# Patient Record
Sex: Male | Born: 1960 | Race: White | Hispanic: No | Marital: Single | Smoking: Current every day smoker
Health system: Southern US, Community
[De-identification: ages and names within clinical notes are randomized; demographics above are authoritative.]

## PROBLEM LIST (undated history)

## (undated) DIAGNOSIS — R739 Hyperglycemia, unspecified: Secondary | ICD-10-CM

## (undated) DIAGNOSIS — R631 Polydipsia: Secondary | ICD-10-CM

## (undated) DIAGNOSIS — N189 Chronic kidney disease, unspecified: Secondary | ICD-10-CM

## (undated) DIAGNOSIS — F209 Schizophrenia, unspecified: Secondary | ICD-10-CM

## (undated) DIAGNOSIS — J449 Chronic obstructive pulmonary disease, unspecified: Secondary | ICD-10-CM

## (undated) DIAGNOSIS — I10 Essential (primary) hypertension: Secondary | ICD-10-CM

## (undated) HISTORY — DX: Schizophrenia, unspecified: F20.9

## (undated) HISTORY — PX: TONSILLECTOMY: SUR1361

## (undated) HISTORY — DX: Essential (primary) hypertension: I10

## (undated) HISTORY — DX: Chronic obstructive pulmonary disease, unspecified: J44.9

## (undated) HISTORY — PX: MULTIPLE TOOTH EXTRACTIONS: SHX2053

## (undated) HISTORY — DX: Chronic kidney disease, unspecified: N18.9

## (undated) HISTORY — DX: Hyperglycemia, unspecified: R73.9

---

## 2002-08-15 ENCOUNTER — Emergency Department (HOSPITAL_COMMUNITY): Admission: EM | Admit: 2002-08-15 | Discharge: 2002-08-15 | Payer: Self-pay | Admitting: *Deleted

## 2003-06-27 ENCOUNTER — Emergency Department (HOSPITAL_COMMUNITY): Admission: EM | Admit: 2003-06-27 | Discharge: 2003-06-27 | Payer: Self-pay | Admitting: Emergency Medicine

## 2004-07-22 ENCOUNTER — Emergency Department (HOSPITAL_COMMUNITY): Admission: EM | Admit: 2004-07-22 | Discharge: 2004-07-22 | Payer: Self-pay | Admitting: Emergency Medicine

## 2004-10-25 ENCOUNTER — Ambulatory Visit (HOSPITAL_COMMUNITY): Admission: RE | Admit: 2004-10-25 | Discharge: 2004-10-25 | Payer: Self-pay | Admitting: Pulmonary Disease

## 2006-04-03 ENCOUNTER — Emergency Department (HOSPITAL_COMMUNITY): Admission: EM | Admit: 2006-04-03 | Discharge: 2006-04-03 | Payer: Self-pay | Admitting: Emergency Medicine

## 2006-06-17 ENCOUNTER — Inpatient Hospital Stay (HOSPITAL_COMMUNITY): Admission: EM | Admit: 2006-06-17 | Discharge: 2006-06-20 | Payer: Self-pay | Admitting: *Deleted

## 2006-06-17 ENCOUNTER — Ambulatory Visit: Payer: Self-pay | Admitting: *Deleted

## 2006-07-12 ENCOUNTER — Emergency Department (HOSPITAL_COMMUNITY): Admission: EM | Admit: 2006-07-12 | Discharge: 2006-07-12 | Payer: Self-pay | Admitting: Emergency Medicine

## 2006-08-21 ENCOUNTER — Emergency Department (HOSPITAL_COMMUNITY): Admission: EM | Admit: 2006-08-21 | Discharge: 2006-08-21 | Payer: Self-pay | Admitting: Emergency Medicine

## 2006-08-23 ENCOUNTER — Inpatient Hospital Stay (HOSPITAL_COMMUNITY): Admission: EM | Admit: 2006-08-23 | Discharge: 2006-08-24 | Payer: Self-pay | Admitting: Emergency Medicine

## 2006-10-20 ENCOUNTER — Emergency Department (HOSPITAL_COMMUNITY): Admission: EM | Admit: 2006-10-20 | Discharge: 2006-10-20 | Payer: Self-pay | Admitting: Emergency Medicine

## 2008-01-08 ENCOUNTER — Emergency Department (HOSPITAL_COMMUNITY): Admission: EM | Admit: 2008-01-08 | Discharge: 2008-01-08 | Payer: Self-pay | Admitting: Emergency Medicine

## 2009-07-30 ENCOUNTER — Other Ambulatory Visit: Payer: Self-pay | Admitting: Emergency Medicine

## 2009-07-30 ENCOUNTER — Ambulatory Visit: Payer: Self-pay | Admitting: Psychiatry

## 2009-07-30 ENCOUNTER — Inpatient Hospital Stay (HOSPITAL_COMMUNITY): Admission: AD | Admit: 2009-07-30 | Discharge: 2009-08-06 | Payer: Self-pay | Admitting: Psychiatry

## 2009-08-18 ENCOUNTER — Emergency Department (HOSPITAL_COMMUNITY): Admission: EM | Admit: 2009-08-18 | Discharge: 2009-08-18 | Payer: Self-pay | Admitting: Emergency Medicine

## 2011-03-29 LAB — COMPREHENSIVE METABOLIC PANEL
ALT: 27 U/L (ref 0–53)
AST: 34 U/L (ref 0–37)
Albumin: 4.2 g/dL (ref 3.5–5.2)
Alkaline Phosphatase: 121 U/L — ABNORMAL HIGH (ref 39–117)
BUN: 13 mg/dL (ref 6–23)
CO2: 33 mEq/L — ABNORMAL HIGH (ref 19–32)
Calcium: 10 mg/dL (ref 8.4–10.5)
Chloride: 93 mEq/L — ABNORMAL LOW (ref 96–112)
Creatinine, Ser: 2.78 mg/dL — ABNORMAL HIGH (ref 0.4–1.5)
GFR calc Af Amer: 29 mL/min — ABNORMAL LOW (ref >60)
GFR calc non Af Amer: 24 mL/min — ABNORMAL LOW (ref >60)
Glucose, Bld: 157 mg/dL — ABNORMAL HIGH (ref 70–99)
Potassium: 3.7 mEq/L (ref 3.5–5.1)
Sodium: 134 mEq/L — ABNORMAL LOW (ref 135–145)
Total Bilirubin: 0.3 mg/dL (ref 0.3–1.2)
Total Protein: 7.5 g/dL (ref 6.0–8.3)

## 2011-03-29 LAB — BASIC METABOLIC PANEL
BUN: 12 mg/dL (ref 6–23)
CO2: 31 mEq/L (ref 19–32)
Calcium: 9.4 mg/dL (ref 8.4–10.5)
Chloride: 86 mEq/L — ABNORMAL LOW (ref 96–112)
Creatinine, Ser: 2.55 mg/dL — ABNORMAL HIGH (ref 0.4–1.5)
GFR calc Af Amer: 32 mL/min — ABNORMAL LOW (ref >60)
GFR calc non Af Amer: 27 mL/min — ABNORMAL LOW (ref >60)
Glucose, Bld: 170 mg/dL — ABNORMAL HIGH (ref 70–99)
Potassium: 3.8 mEq/L (ref 3.5–5.1)
Sodium: 123 mEq/L — ABNORMAL LOW (ref 135–145)

## 2011-03-29 LAB — CBC
HCT: 42.8 % (ref 39.0–52.0)
Hemoglobin: 15.2 g/dL (ref 13.0–17.0)
MCHC: 35.5 g/dL (ref 30.0–36.0)
MCV: 89.5 fL (ref 78.0–100.0)
Platelets: 212 10*3/uL (ref 150–400)
RBC: 4.79 MIL/uL (ref 4.22–5.81)
RDW: 14.3 % (ref 11.5–15.5)
WBC: 9.7 10*3/uL (ref 4.0–10.5)

## 2011-03-29 LAB — ETHANOL: Alcohol, Ethyl (B): <5 mg/dL (ref 0–10)

## 2011-03-29 LAB — DIFFERENTIAL
Basophils Absolute: 0 10*3/uL (ref 0.0–0.1)
Basophils Relative: 0 % (ref 0–1)
Eosinophils Absolute: 0.4 10*3/uL (ref 0.0–0.7)
Eosinophils Relative: 4 % (ref 0–5)
Lymphocytes Relative: 14 % (ref 12–46)
Lymphs Abs: 1.3 10*3/uL (ref 0.7–4.0)
Monocytes Absolute: 0.4 10*3/uL (ref 0.1–1.0)
Monocytes Relative: 4 % (ref 3–12)
Neutro Abs: 7.5 10*3/uL (ref 1.7–7.7)
Neutrophils Relative %: 78 % — ABNORMAL HIGH (ref 43–77)

## 2011-05-06 NOTE — Discharge Summary (Signed)
Kyle Valenzuela, Kyle Valenzuela NO.:  [id number]   MEDICAL RECORD NO.:  [id number]          PATIENT TYPE:  IPS   LOCATION:  0402                          FACILITY:  [facility]   PHYSICIAN:  Norm Salt, MD  DATE OF BIRTH:  August 05, 1961   DATE OF ADMISSION:  07/30/2009  DATE OF DISCHARGE:  08/06/2009                               DISCHARGE SUMMARY   IDENTIFYING DATA AND REASON FOR ADMISSION:  This was an inpatient  psychiatric admission for Kyle Valenzuela, a 50 year old unmarried Caucasian  male who was admitted for stabilization of acute agitation within a  context of chronic schizophrenia.  Please refer to the admission note  for further details pertaining to the symptoms, circumstances and  history that led to his hospitalization.  He was given an initial Axis I  diagnosis of schizoaffective disorder, NOS.   MEDICAL AND LABORATORY:  The patient was medically and physically  assessed by the psychiatric nurse practitioner.  He was in good health  without any active or chronic medical problems with the exception of  hypertension which was being addressed with Inderal long-acting, 60 mg  daily.  There were no significant medical issues.  He had a history of  renal insufficiency, but this did not appear to be in evidence during  his stay.   HOSPITAL COURSE:  The patient was admitted to the adult inpatient  psychiatric service.  He presented as a well-nourished, normally-  developed adult male with significant problems with thinking, thought  processing, cognition, and insight.  He was moderately anxious but  generally cooperative and directable.  He had limited understanding of  the situation that he was in.  It was difficult for [place] to determine to  what degree this represented his baseline level of functioning.   He had been living at a group home, and we endeavored to find an  appropriate assisted living situation for him upon his stabilization  with [place].   He was treated with a  psychotropic regimen of Restoril, Xanax, Zyprexa,  and Depakote.   On the eighth hospital day, he appeared appropriate for discharge.  He  was accepted at the [facility] in  [place].  Following aftercare arrangements were made for him.   AFTERCARE:  The patient was to follow up at [facility] in  [place] with an appointment on August 08, 2009, at 8:00 a.m.   DISCHARGE MEDICATIONS:  1. Restoril 15 mg at bedtime.  2. Xanax 1 mg t.i.d.  3. Inderal long-acting 60 mg daily.  4. Zyprexa 15 mg at bedtime and 10 mg q.a.m.  5. Depakote 250 mg q.a.m. and 500 mg at bedtime.   DISCHARGE DIAGNOSES:  AXIS I:  Schizoaffective disorder, not otherwise  specified.  AXIS II:  Deferred.  AXIS III:  History of hypertension.  AXIS IV:  Stressors:  Severe.  AXIS V:  Global Assessment of Functioning on discharge of 45.      Norm Salt, MD  Electronically Signed     SPB/MEDQ  D:  08/08/2009  T:  08/08/2009  Job:  [id number]

## 2011-05-09 NOTE — Discharge Summary (Signed)
NAME:  Kyle Valenzuela, Kyle Valenzuela             ACCOUNT NO.:  [id number]   MEDICAL RECORD NO.:  [id number]          PATIENT TYPE:  INP   LOCATION:  A302                          [facility]ILITY:  [facility]   PHYSICIAN:  Vanetta Mulders. Dechurch, M.D.DATE OF BIRTH:  [birth year]   DATE OF ADMISSION:  08/23/2006  DATE OF DISCHARGE:  09/03/2007LH                                 DISCHARGE SUMMARY   HOSPITAL COURSE:  Briefly, the patient is a 50 year old gentleman followed  by Dr. Luan Pulling, who is a resident of the [facility], who  presented to the emergency room because of cellulitis of the right lower  extremity.  He had an uncomplicated right cellulitis, but was not responding  to outpatient therapy.  He was seen in the emergency room 2 days prior.  Apparently, he had not had any antibiotics as he was unable to have them  filled.  Circumstances were unclear.  In any event, the patient was  admitted to hospital to ensure that he was getting IV antibiotics.  He was  given Unasyn in the emergency room and this was continued.  The patient was  very agitated during his brief hospital stay and stated he would rather have  his foot cut off than to have to stay in the hospital.  Apparently, he is a  heavy tobacco abuser and has been quite agitated at times.  He signed out  AMA, despite efforts to compromise.  The administrator of his group home  agreed to follow up as an outpatient and he may need involuntary commitment  if he remains noncompliant.  The patient has a longstanding history of  apparent schizophrenia and has been followed in the mental health system.   DISCHARGE MEDICATIONS:  He was to continue his usual medications which  include:  1. Abilify 15 mg at bedtime.  2. Restoril 15 mg q.h.s. p.r.n.  3. Zyprexa 15 mg at bedtime.  4. Inderal LA 60 mg daily.  5. Percocet 5/325 q.4 h. p.r.n. pain.  6. He had a prescription for Augmentin 875 mg b.i.d., which was to be      filled through the rest home  and completed.   Because the patient signed before full assessment could be undertaken, this  record is not deemed complete.  The details of the patient's status were  discussed with Dr. Luan Pulling on August 24, 2006.      Vanetta Mulders Hillery Jacks, M.D.  Electronically Signed     FED/MEDQ  D:  09/14/2006  T:  09/15/2006  Job:  [id number]

## 2011-05-09 NOTE — Discharge Summary (Signed)
NAME:  Kyle Valenzuela, Kyle Valenzuela NO.:  [id number]   MEDICAL RECORD NO.:  [id number]          PATIENT TYPE:  IPS   LOCATION:  0405                          [facility]ILITY:  [facility]   PHYSICIAN:  Stark Jock, M.D. DATE OF BIRTH:  12/18/1961   DATE OF ADMISSION:  06/17/2006  DATE OF DISCHARGE:  06/20/2006                                 DISCHARGE SUMMARY   IDENTIFYING INFORMATION:  The patient is a 50 year old single Caucasian male  who was admitted on a voluntary basis to my service on June 17, 2006.   HISTORY OF PRESENT ILLNESS:  The patient presented in the ED stating that he  wanted to kill himself if he had to return to live in his current assisted-  living home ([place]).  He stated he does  not like living there and does not want to return.  He states that they had  caught me at a bad moment and I wanted to get out of there.  He denied  suicidal ideation on talking with the examiner today but had been suicidal  the night before.   PAST PSYCHIATRIC HISTORY:  The patient sees Dr. Theda Sers at [facility].  This is his first [facility] admission.  He has had a  history of previous admissions to [facility] 10 years ago.  He  has been diagnosed with schizoaffective disorder, bipolar-type.  He has  previously been on lithium and Depakote.   FAMILY HISTORY:  Not available.   SUBSTANCE ABUSE HISTORY:  No evidence of substance abuse.   MEDICAL HISTORY:  The patient has elevated creatinine and history of  treatment with lithium.   MEDICATIONS:  Zyprexa 15 mg p.o. q.h.s., Inderal LA 60 mg p.o. q.a.m.,  Abilify 15 mg p.o. q.h.s., temazepam 15 mg p.o. q.h.s.   ALLERGIES:  No known drug allergies.   PHYSICAL EXAMINATION:  The physical examination was done in the ED prior to  transfer to our unit.  It was evaluated by our nurse practitioner.  He was  in no acute distress.  He was noted to be edentulous and had a  protuberant  abdomen but otherwise was healthy.   LABORATORY DATA:  CBC with WBC count 6.7, hemoglobin 15.8, hematocrit 45.3,  platelets 131,000.  Basic metabolic panel with sodium 138, potassium 3.2,  chloride 103, CO2 25, BUN 16, creatinine 2.9, glucose 107, calcium 9.1.  Alcohol level was less than 5.  UDS was negative.   HOSPITAL COURSE:  Upon admission, the patient was started on Abilify 15 mg  p.o. q.h.s., Inderal LA 60 mg p.o. q.d., Zyprexa 15 mg p.o. q.h.s. and  temazepam 15 mg p.o. q.h.s.  On June 18, 2006, the patient was started on a  nicotine patch 21 mg daily.  The patient tolerated his medications well with  no significant side effects.  There were no changes made in his medications  as he seemed to stabilize quickly after admission.   Upon admission, the patient was seen by me and talked about his suicidal  statements.  He stated he had had a  plan to walk into traffic.  He stated he  did not want to stay at his current assisted-living home and was attempting  to get out of this placement.  It was unclear how serious his suicidal  ideation was versus manipulative.  On June 18, 2006, the patient was quiet  and isolative and somewhat withdrawn.  He stayed in his room a lot with  minimal interaction with others.  He stated he was anxious.  On June 19, 2006, the patient was much more conversant.  He was asking for discharge.  He was stating he was never suicidal but wanted to get out of his living  situation.  He states he now wants to return to this assisted-living home  (he has lived there for 10 years).  He denies suicidal or homicidal  ideation.  He was less depressed.  There was no psychosis.  It was decided  he would be discharged tomorrow.   On June 20, 2006, the patient's mental status had improved markedly from  admission status.  He was much more forthcoming and engaging with good eye  contact.  Speech still somewhat soft and slow but less so than upon  admission.   Psychomotor activity was within normal limits.  Mood less  depressed and anxious.  Affect wider range.  There was no suicidal or  homicidal ideation.  No self-injurious behavior.  No auditory or visual  hallucinations.  No paranoia or delusions.  Thoughts were logical and goal  directed.  Thought content with no predominant theme.  Cognitive exam was  grossly within normal limits.  The patient was excited about his return to  his current residence, the [facility].   DISCHARGE DIAGNOSES:  AXIS I:  Schizoaffective disorder, bipolar-type.  AXIS II:  None.  AXIS III:  Thrombocytopenia not otherwise specified, elevated creatinine  (creatinine is stable compared to values one year ago), rule out renal  insufficiency.  AXIS IV:  Moderate (housing problem and problems with primary support  group).  AXIS V:  GAF upon discharge 66; GAF upon admission 32; GAF highest past year  50.   ACTIVITY/DIET:  No specific activity level or dietary restrictions.   DISCHARGE MEDICATIONS:  1.  Abilify 15 mg p.o. q.h.s.  2.  Inderal LA 60 mg p.o. q.d.  3.  Zyprexa 15 mg p.o. q.h.s.  4.  Temazepam 15 mg p.o. q.h.s.   POST-HOSPITAL CARE PLANS:  The patient will return home to live at the  [facility].  He has lived there for 10 years  and is well-known to the staff.  Follow-up psychiatric treatment will be at  the [facility] with Dr. Theda Sers on June 25, 2006  at 10 a.m.      Stark Jock, M.D.  Electronically Signed     BHS/MEDQ  D:  06/20/2006  T:  06/20/2006  Job:  [id number]

## 2012-02-27 ENCOUNTER — Encounter: Payer: Self-pay | Admitting: Surgery

## 2012-03-01 ENCOUNTER — Encounter (INDEPENDENT_AMBULATORY_CARE_PROVIDER_SITE_OTHER): Payer: Medicare Other | Admitting: *Deleted

## 2012-03-01 ENCOUNTER — Encounter: Payer: Self-pay | Admitting: Surgery

## 2012-03-01 ENCOUNTER — Ambulatory Visit (INDEPENDENT_AMBULATORY_CARE_PROVIDER_SITE_OTHER): Payer: Medicare Other | Admitting: Surgery

## 2012-03-01 VITALS — BP 129/82 | HR 80 | Resp 16 | Ht 68.0 in | Wt 180.0 lb

## 2012-03-01 DIAGNOSIS — Z01810 Encounter for preprocedural cardiovascular examination: Secondary | ICD-10-CM

## 2012-03-01 DIAGNOSIS — N186 End stage renal disease: Secondary | ICD-10-CM

## 2012-03-01 DIAGNOSIS — I129 Hypertensive chronic kidney disease with stage 1 through stage 4 chronic kidney disease, or unspecified chronic kidney disease: Secondary | ICD-10-CM

## 2012-03-01 NOTE — Progress Notes (Signed)
[facility]ascular and [facility]in Specialist of [facility]   Patient name: Kyle Valenzuela MRN: [id number] DOB: Feb 03, 1961 Sex: male   Referred by: Dr. Lowanda Foster  Reason for referral:  Chief Complaint  Patient presents with  . New Evaluation    Eval for access placement for dialysis - Ref. Dr. Dustin Flock    HISTORY OF PRESENT ILLNESS: The patient comes in today for evaluation of dialysis access. He is not yet on dialysis. He is right-handed. The patient's renal insufficiency a secondary to hypertension. He does not have signs of uremia at this time.  The patient suffers from schizophrenia and is in a group home. He also has a history of COPD secondary to tobacco abuse.  Past Medical History  Diagnosis Date  . Chronic kidney disease   . Hypertension   . Schizophrenia   . COPD (chronic obstructive pulmonary disease)   . Hyperglycemia     History reviewed. No pertinent past surgical history.  History   Social History  . Marital Status: Single    Spouse Name: N/A    Number of Children: N/A  . Years of Education: N/A   Occupational History  . Not on file.   Social History Main Topics  . Smoking status: Current Everyday Smoker -- 5.0 packs/day    Types: Cigarettes  . Smokeless tobacco: Not on file  . Alcohol Use: No  . Drug Use: No  . Sexually Active: Not on file   Other Topics Concern  . Not on file   Social History Narrative  . No narrative on file    History reviewed. No pertinent family history.  Allergies as of 03/01/2012  . (No Known Allergies)    Current Outpatient Prescriptions on File Prior to Visit  Medication Sig Dispense Refill  . ALPRAZolam (XANAX) 1 MG tablet Take 1 mg by mouth at bedtime as needed.      Marland Kitchen amLODipine (NORVASC) 10 MG tablet Take 10 mg by mouth daily.      . carvedilol (COREG) 6.25 MG tablet Take 6.25 mg by mouth 2 (two) times daily with a meal.      . divalproex (DEPAKOTE) 500 MG DR tablet Take 500 mg by mouth 2 (two) times daily.      .  hydrOXYzine (ATARAX/VISTARIL) 50 MG tablet Take 50 mg by mouth 3 (three) times daily as needed.      Marland Kitchen OLANZapine (ZYPREXA) 10 MG tablet Take 10 mg by mouth daily.      Marland Kitchen OLANZapine (ZYPREXA) 15 MG tablet Take 15 mg by mouth at bedtime.      . traZODone (DESYREL) 150 MG tablet Take 150 mg by mouth at bedtime.         REVIEW OF SYSTEMS: No chest pain no shortness of breath no neurologic symptoms. Positive for depression all of systems negative as documented in the encounter for  PHYSICAL EXAMINATION: General: The patient appears their stated age.  Vital signs are BP 129/82  Pulse 80  Resp 16  Ht 5\' 8"  (1.727 m)  Wt 180 lb (81.647 kg)  BMI 27.37 kg/m2  SpO2 99% HEENT:  No gross abnormalities Pulmonary: Respirations are non-labored Abdomen: Soft and non-tender  Musculoskeletal: There are no major deformities.   Neurologic: No focal weakness or paresthesias are detected, Skin: There are no ulcer or rashes noted. Psychiatric: The patient has normal affect. Cardiovascular: Palpable right radial and brachial pulse  Diagnostic Studies: Vein mapping reveals adequate right cephalic vein it measures 0.3 in the distal forearm and  narrows down to 0.26 in the midforearm and from there more proximal it is greater than 0.37  Outside Studies/Documentation Historical records were reviewed.  They showed stage chronic renal insufficiency  Medication Changes: None  Assessment:  Chronic renal insufficiency Plan: I had a lengthy discussion with the patient regarding dialysis access. I think he is a good candidate for a right radiocephalic fistula. We discussed the risks of non-maturity and the risk of steal syndrome. The patient is a little over wound was all the information and has not yet ready to make a decision. He will contact my office when he is ready.     Eldridge Abrahams, M.D. [facility] [place] Office: [phone] Pager:  [phone]

## 2012-03-08 NOTE — Procedures (Unsigned)
CEPHALIC VEIN MAPPING  INDICATION:  Chronic kidney disease secondary to hypertension.  HISTORY:  EXAM:  The right cephalic vein is compressible with diameter measurements ranging from 0.26 to 0.54 cm.  The right basilic vein is compressible with diameter measurements ranging from 0.44 to 0.48 cm.  The left cephalic vein is compressible with diameter measurements ranging from 0.18 to 0.33 cm.  The left basilic vein is compressible with diameter measurements ranging from 0.30 to 0.59 cm.  See attached worksheet for all measurements.  IMPRESSION:  Patent bilateral cephalic and basilic veins noted with diameters as described above.  ___________________________________________ V. Leia Alf, MD  CH/MEDQ  D:  03/02/2012  T:  03/02/2012  Job:  [id number]

## 2012-04-12 ENCOUNTER — Telehealth: Payer: Self-pay | Admitting: *Deleted

## 2012-04-12 NOTE — Telephone Encounter (Signed)
Cenedra works at the group home in [place] where Mr Kyle Valenzuela resides.She said her supervisor asked her to call and see if we could do his surgery in [place]. She said that was the only way the pt would consent to surgery. I told her we were privileged with the [facility] system and could/would not operate at [facility].I suggested she cal Dr Lowanda Foster and talk with that office.

## 2012-04-14 ENCOUNTER — Other Ambulatory Visit: Payer: Self-pay | Admitting: *Deleted

## 2012-04-14 ENCOUNTER — Encounter (HOSPITAL_COMMUNITY): Payer: Self-pay | Admitting: Pharmacy Technician

## 2012-04-14 ENCOUNTER — Encounter: Payer: Self-pay | Admitting: *Deleted

## 2012-04-16 ENCOUNTER — Inpatient Hospital Stay (HOSPITAL_COMMUNITY): Admission: RE | Admit: 2012-04-16 | Discharge: 2012-04-16 | Payer: Medicare Other | Source: Ambulatory Visit

## 2012-04-19 NOTE — Pre-Procedure Instructions (Signed)
[place]  04/19/2012   Your procedure is scheduled on:  Apr 22, 2012  Report to [place] at 7:30 AM.  Call this number if you have problems the morning of surgery: [phone]   Remember:   Do not eat food:After Midnight.  May have clear liquids: up to 4 Hours before arrival(3:30 am).  Clear liquids include soda, tea, black coffee, apple or grape juice, broth.  Take these medicines the morning of surgery with A SIP OF WATER: depakote, coreg, novasc,inhaler,xanax/tylenol as needed   Do not wear jewelry, make-up or nail polish.  Do not wear lotions, powders, or perfumes. You may wear deodorant.  Do not shave 48 hours prior to surgery.  Do not bring valuables to the hospital.  Contacts, dentures or bridgework may not be worn into surgery.  Leave suitcase in the car. After surgery it may be brought to your room.  For patients admitted to the hospital, checkout time is 11:00 AM the day of discharge.   Patients discharged the day of surgery will not be allowed to drive home.  Name and phone number of your driver: Mady Gemma (group home manager)  [phone]  Special Instructions: CHG Shower Use Special Wash: 1/2 bottle night before surgery and 1/2 bottle morning of surgery.   Please read over the following fact sheets that you were given: Pain Booklet, Coughing and Deep Breathing and Surgical Site Infection Prevention

## 2012-04-20 ENCOUNTER — Encounter (HOSPITAL_COMMUNITY)
Admission: RE | Admit: 2012-04-20 | Discharge: 2012-04-20 | Disposition: A | Discharge status: Home / Self Care | Payer: Medicare Other | Source: Ambulatory Visit | Attending: Surgery | Admitting: Surgery

## 2012-04-20 ENCOUNTER — Encounter (HOSPITAL_COMMUNITY)
Admission: RE | Admit: 2012-04-20 | Discharge: 2012-04-20 | Disposition: A | Discharge status: Home / Self Care | Payer: Medicare Other | Source: Ambulatory Visit | Attending: Anesthesiology | Admitting: Anesthesiology

## 2012-04-20 ENCOUNTER — Encounter (HOSPITAL_COMMUNITY): Payer: Self-pay

## 2012-04-20 LAB — CBC
HCT: 41.1 % (ref 39.0–52.0)
Hemoglobin: 14 g/dL (ref 13.0–17.0)
MCH: 29.7 pg (ref 26.0–34.0)
MCHC: 34.1 g/dL (ref 30.0–36.0)
MCV: 87.3 fL (ref 78.0–100.0)
Platelets: 140 K/uL — ABNORMAL LOW (ref 150–400)
RBC: 4.71 MIL/uL (ref 4.22–5.81)
RDW: 13.3 % (ref 11.5–15.5)
WBC: 5.4 K/uL (ref 4.0–10.5)

## 2012-04-20 LAB — SURGICAL PCR SCREEN
MRSA, PCR: NEGATIVE
Staphylococcus aureus: NEGATIVE

## 2012-04-21 MED ORDER — DEXTROSE 5 % IV SOLN
1.5000 g | INTRAVENOUS | Status: AC
  Administered 2012-04-22: 1.5 g via INTRAVENOUS
  Filled 2012-04-21: qty 1.5

## 2012-04-21 MED ORDER — SODIUM CHLORIDE 0.9 % IV SOLN
INTRAVENOUS | Status: DC
  Administered 2012-04-22: 11:00:00 via INTRAVENOUS

## 2012-04-21 NOTE — Consult Note (Signed)
Anesthesia Chart Review:  Patient is a 51 year old male scheduled for creation of a right Cimino AVF on 04/22/12.  This  History includes CKD not yet on HD, HTN, Schizophrenia, smoking, COPD, hyperglycemia without diagnosis of DM.  Nephrologist is Dr. Hinda Lenis.  CXR from 04/20/12 shows no active cardiopulmonary disease.  EKG from 04/20/12 shows NSR, LAD, incomplete right BBB, cannot rule out inferior infarct (age undetermined).  Currently there are no comparison EKGs available.  There were none at [facility] or in [place].  He is for labs on arrival.  No CV symptoms documented at PAT and no known history of CAD, although with multiple risk factors.  He need access in anticipation of HD in the near future.  If remains asymptomatic and labs reasonable, then anticipate he can proceed with this procedure.  I reviewed above with Anesthesiologist Dr. Conrad Ketchum who agrees with this plan.  Myra Gianotti, PA-C

## 2012-04-22 ENCOUNTER — Ambulatory Visit (HOSPITAL_COMMUNITY)
Admission: RE | Admit: 2012-04-22 | Discharge: 2012-04-22 | Disposition: A | Discharge status: Hospice | Payer: Medicare Other | Source: Ambulatory Visit | Attending: Surgery | Admitting: Surgery

## 2012-04-22 ENCOUNTER — Encounter (HOSPITAL_COMMUNITY)
Admission: RE | Disposition: A | Discharge status: Hospice | Payer: Self-pay | Source: Ambulatory Visit | Attending: Surgery

## 2012-04-22 ENCOUNTER — Encounter (HOSPITAL_COMMUNITY): Payer: Self-pay | Admitting: Vascular Surgery

## 2012-04-22 ENCOUNTER — Ambulatory Visit (HOSPITAL_COMMUNITY): Payer: Medicare Other | Admitting: Vascular Surgery

## 2012-04-22 DIAGNOSIS — R7309 Other abnormal glucose: Secondary | ICD-10-CM | POA: Insufficient documentation

## 2012-04-22 DIAGNOSIS — F209 Schizophrenia, unspecified: Secondary | ICD-10-CM | POA: Insufficient documentation

## 2012-04-22 DIAGNOSIS — Z01812 Encounter for preprocedural laboratory examination: Secondary | ICD-10-CM | POA: Insufficient documentation

## 2012-04-22 DIAGNOSIS — F17200 Nicotine dependence, unspecified, uncomplicated: Secondary | ICD-10-CM | POA: Insufficient documentation

## 2012-04-22 DIAGNOSIS — N184 Chronic kidney disease, stage 4 (severe): Secondary | ICD-10-CM | POA: Insufficient documentation

## 2012-04-22 DIAGNOSIS — J4489 Other specified chronic obstructive pulmonary disease: Secondary | ICD-10-CM | POA: Insufficient documentation

## 2012-04-22 DIAGNOSIS — J449 Chronic obstructive pulmonary disease, unspecified: Secondary | ICD-10-CM | POA: Insufficient documentation

## 2012-04-22 DIAGNOSIS — I129 Hypertensive chronic kidney disease with stage 1 through stage 4 chronic kidney disease, or unspecified chronic kidney disease: Secondary | ICD-10-CM | POA: Insufficient documentation

## 2012-04-22 HISTORY — PX: AV FISTULA PLACEMENT: SHX1204

## 2012-04-22 LAB — PROTIME-INR
INR: 0.97 (ref 0.00–1.49)
Prothrombin Time: 13.1 seconds (ref 11.6–15.2)

## 2012-04-22 LAB — POCT I-STAT 4, (NA,K, GLUC, HGB,HCT)
Glucose, Bld: 90 mg/dL (ref 70–99)
HCT: 44 % (ref 39.0–52.0)
Sodium: 145 mEq/L (ref 135–145)

## 2012-04-22 SURGERY — ARTERIOVENOUS (AV) FISTULA CREATION
Anesthesia: General | Site: Arm Lower | Laterality: Right | Wound class: Clean

## 2012-04-22 MED ORDER — SODIUM CHLORIDE 0.9 % IV SOLN
INTRAVENOUS | Status: DC | PRN
  Administered 2012-04-22 (×2): via INTRAVENOUS

## 2012-04-22 MED ORDER — OXYCODONE HCL 5 MG PO TABS: 5.0000 mg | ORAL_TABLET | ORAL | Status: AC | PRN

## 2012-04-22 MED ORDER — PROTAMINE SULFATE 10 MG/ML IV SOLN
INTRAVENOUS | Status: DC | PRN
  Administered 2012-04-22 (×2): 5 mg via INTRAVENOUS

## 2012-04-22 MED ORDER — FENTANYL CITRATE 0.05 MG/ML IJ SOLN
INTRAMUSCULAR | Status: DC | PRN
  Administered 2012-04-22 (×2): 25 ug via INTRAVENOUS
  Administered 2012-04-22 (×2): 50 ug via INTRAVENOUS

## 2012-04-22 MED ORDER — 0.9 % SODIUM CHLORIDE (POUR BTL) OPTIME
TOPICAL | Status: DC | PRN
  Administered 2012-04-22: 1000 mL

## 2012-04-22 MED ORDER — PROPOFOL 10 MG/ML IV EMUL
INTRAVENOUS | Status: DC | PRN
  Administered 2012-04-22: 200 mg via INTRAVENOUS

## 2012-04-22 MED ORDER — HEPARIN SODIUM (PORCINE) 5000 UNIT/ML IJ SOLN
INTRAMUSCULAR | Status: DC | PRN
  Administered 2012-04-22: 12:00:00

## 2012-04-22 MED ORDER — HEPARIN SODIUM (PORCINE) 1000 UNIT/ML IJ SOLN
INTRAMUSCULAR | Status: DC | PRN
  Administered 2012-04-22: 3 mL via INTRAVENOUS

## 2012-04-22 MED ORDER — HYDROMORPHONE HCL PF 1 MG/ML IJ SOLN: 0.2500 mg | INTRAMUSCULAR | Status: DC | PRN

## 2012-04-22 MED ORDER — ONDANSETRON HCL 4 MG/2ML IJ SOLN
INTRAMUSCULAR | Status: DC | PRN
  Administered 2012-04-22: 4 mg via INTRAVENOUS

## 2012-04-22 MED ORDER — MIDAZOLAM HCL 5 MG/5ML IJ SOLN
INTRAMUSCULAR | Status: DC | PRN
  Administered 2012-04-22 (×2): 1 mg via INTRAVENOUS

## 2012-04-22 MED ORDER — ONDANSETRON HCL 4 MG/2ML IJ SOLN: 4.0000 mg | Freq: Once | INTRAMUSCULAR | Status: DC | PRN

## 2012-04-22 SURGICAL SUPPLY — 41 items
ADH SKN CLS APL DERMABOND .7 (GAUZE/BANDAGES/DRESSINGS) ×1
CANISTER SUCTION 2500CC (MISCELLANEOUS) ×2 IMPLANT
CLIP TI MEDIUM 6 (CLIP) ×2 IMPLANT
CLIP TI WIDE RED SMALL 6 (CLIP) ×2 IMPLANT
CLOTH BEACON ORANGE TIMEOUT ST (SAFETY) ×2 IMPLANT
COVER PROBE W GEL 5X96 (DRAPES) ×2 IMPLANT
COVER SURGICAL LIGHT HANDLE (MISCELLANEOUS) ×4 IMPLANT
DERMABOND ADVANCED (GAUZE/BANDAGES/DRESSINGS) ×1
DERMABOND ADVANCED .7 DNX12 (GAUZE/BANDAGES/DRESSINGS) ×1 IMPLANT
ELECT REM PT RETURN 9FT ADLT (ELECTROSURGICAL) ×2
ELECTRODE REM PT RTRN 9FT ADLT (ELECTROSURGICAL) ×1 IMPLANT
GLOVE BIOGEL M 6.5 STRL (GLOVE) ×1 IMPLANT
GLOVE BIOGEL M 7.0 STRL (GLOVE) ×1 IMPLANT
GLOVE BIOGEL PI IND STRL 6.5 (GLOVE) IMPLANT
GLOVE BIOGEL PI IND STRL 7.0 (GLOVE) IMPLANT
GLOVE BIOGEL PI IND STRL 7.5 (GLOVE) ×1 IMPLANT
GLOVE BIOGEL PI INDICATOR 6.5 (GLOVE) ×3
GLOVE BIOGEL PI INDICATOR 7.0 (GLOVE) ×4
GLOVE BIOGEL PI INDICATOR 7.5 (GLOVE) ×1
GLOVE ECLIPSE 6.5 STRL STRAW (GLOVE) ×3 IMPLANT
GLOVE SURG SS PI 7.0 STRL IVOR (GLOVE) ×2 IMPLANT
GLOVE SURG SS PI 7.5 STRL IVOR (GLOVE) ×2 IMPLANT
GOWN PREVENTION PLUS XLARGE (GOWN DISPOSABLE) ×2 IMPLANT
GOWN PREVENTION PLUS XXLARGE (GOWN DISPOSABLE) ×2 IMPLANT
GOWN STRL NON-REIN LRG LVL3 (GOWN DISPOSABLE) ×8 IMPLANT
HEMOSTAT SNOW SURGICEL 2X4 (HEMOSTASIS) IMPLANT
HEMOSTAT SURGICEL 2X14 (HEMOSTASIS) IMPLANT
KIT BASIN OR (CUSTOM PROCEDURE TRAY) ×2 IMPLANT
KIT ROOM TURNOVER OR (KITS) ×2 IMPLANT
NS IRRIG 1000ML POUR BTL (IV SOLUTION) ×2 IMPLANT
PACK CV ACCESS (CUSTOM PROCEDURE TRAY) ×2 IMPLANT
PAD ARMBOARD 7.5X6 YLW CONV (MISCELLANEOUS) ×4 IMPLANT
SUT PROLENE 6 0 CC (SUTURE) ×2 IMPLANT
SUT PROLENE 7 0 BV 1 (SUTURE) ×4 IMPLANT
SUT VIC AB 3-0 SH 27 (SUTURE) ×2
SUT VIC AB 3-0 SH 27X BRD (SUTURE) ×1 IMPLANT
SUT VICRYL 4-0 PS2 18IN ABS (SUTURE) IMPLANT
TOWEL OR 17X24 6PK STRL BLUE (TOWEL DISPOSABLE) ×2 IMPLANT
TOWEL OR 17X26 10 PK STRL BLUE (TOWEL DISPOSABLE) ×2 IMPLANT
UNDERPAD 30X30 INCONTINENT (UNDERPADS AND DIAPERS) ×2 IMPLANT
WATER STERILE IRR 1000ML POUR (IV SOLUTION) ×2 IMPLANT

## 2012-04-22 NOTE — Anesthesia Preprocedure Evaluation (Addendum)
Anesthesia Evaluation  Patient identified by MRN, date of birth, ID band Patient awake    Reviewed: Allergy & Precautions, H&P , NPO status , Patient's Chart, lab work & pertinent test results  History of Anesthesia Complications Negative for: history of anesthetic complications  Airway Mallampati: II TM Distance: >3 FB     Dental  (+) Edentulous Upper, Edentulous Lower and Dental Advisory Given   Pulmonary COPDCurrent Smoker,  breath sounds clear to auscultation        Cardiovascular hypertension, Pt. on home beta blockers Rhythm:Regular Rate:Normal     Neuro/Psych Schizophrenia negative neurological ROS  negative psych ROS   GI/Hepatic negative GI ROS, Neg liver ROS,   Endo/Other  negative endocrine ROS  Renal/GU negative Renal ROS  negative genitourinary   Musculoskeletal negative musculoskeletal ROS (+)   Abdominal   Peds  Hematology negative hematology ROS (+)   Anesthesia Other Findings   Reproductive/Obstetrics                         Anesthesia Physical Anesthesia Plan  ASA: III  Anesthesia Plan: General   Post-op Pain Management:    Induction: Intravenous  Airway Management Planned: LMA  Additional Equipment:   Intra-op Plan:   Post-operative Plan: Extubation in OR  Informed Consent: I have reviewed the patients History and Physical, chart, labs and discussed the procedure including the risks, benefits and alternatives for the proposed anesthesia with the patient or authorized representative who has indicated his/her understanding and acceptance.   Dental advisory given  Plan Discussed with: CRNA, Anesthesiologist and Surgeon  Anesthesia Plan Comments: (ESRD not started dialysis K-3.9 Htn Chronic schiazophrenia)      Anesthesia Quick Evaluation

## 2012-04-22 NOTE — Preoperative (Signed)
Beta Blockers   Reason not to administer Beta Blockers:Coreg taken this am 04/22/12

## 2012-04-22 NOTE — Transfer of Care (Signed)
Immediate Anesthesia Transfer of Care Note  Patient: Kyle Valenzuela  Procedure(s) Performed: Procedure(s) (LRB): ARTERIOVENOUS (AV) FISTULA CREATION (Right)  Patient Location: PACU  Anesthesia Type: General  Level of Consciousness: awake and patient cooperative  Airway & Oxygen Therapy: Patient Spontanous Breathing and Patient connected to face mask oxygen  Post-op Assessment: Report given to PACU RN and Post -op Vital signs reviewed and stable  Post vital signs: Reviewed and stable  Complications: No apparent anesthesia complications

## 2012-04-22 NOTE — Op Note (Signed)
Vascular and Vein Specialists of [facility]  Patient name: Kyle Valenzuela MRN: [id number] DOB: April 03, 1961 Sex: male  04/22/2012 Pre-operative Diagnosis: CKD, stage 4 Post-operative diagnosis:  Same Surgeon:  Eldridge Abrahams Assistants:  Wray Kearns, P.A. Procedure:   Right radio-cephalic fistula Anesthesia:  General Blood Loss:  See anesthesia record Specimens:  none  Findings:  1.28mm artery, vein dilated to 3.0  Indications:  50 yo with stage 4 renal disease comes in today for fistula placement  Procedure:  The patient was identified in the holding area and taken to [place]  The patient was then placed supine on the table. general anesthesia was administered.  The patient was prepped and draped in the usual sterile fashion.  A time out was called and antibiotics were administered.  Ultrasound was used to mark the course of the cephalic vein in the forearm.  A longitudinal incision was made between the course of the radial artery and cephalic vein.  The vein was dissected out first.  It was fully mobilized throughout the incision.  Next, The artery was isolated.  3000 units of heparin was administered.  The vein was marked for orientation.  It was ligated distally with a 3-0 silk tie.  The vein was then sequentially dilated using dilators up to a #3.  The artery was then occluded.  A #11 blade was used to make an arteriotomy which was extended with potts scissors.  The vein was spatulated to fit the size of the arteriotomy.  A running anastomosis was created in end to side fashion with 7-0 prolene.  Prior to completion, the appropriate flush maneuvers were performed.  The anastomosis was completed.  I inspected the course of the vein to make sure there were no kinks.  There was a good doppler signal in the vein up to the elbow.  The radial and ulnar artery had multiphasic signals distal to the anastomosis.  25 mg of protamine was given.  Once hemostasis was obtained, the tissue was  closed in 2 layers of 3-0 vicryl.  Dermabond was placed   Disposition:  To PACU in stable condition.   Theotis Burrow, M.D. [facility] Specialists of [place] Office: [phone] Pager:  [phone]

## 2012-04-22 NOTE — Anesthesia Procedure Notes (Signed)
Procedure Name: LMA Insertion Date/Time: 04/22/2012 12:39 PM Performed by: Sherilyn Banker Pre-anesthesia Checklist: Patient identified, Emergency Drugs available, Suction available, Patient being monitored and Timeout performed Patient Re-evaluated:Patient Re-evaluated prior to inductionOxygen Delivery Method: Circle system utilized Preoxygenation: Pre-oxygenation with 100% oxygen Intubation Type: IV induction LMA: LMA inserted LMA Size: 5.0 Tube secured with: Tape Dental Injury: Teeth and Oropharynx as per pre-operative assessment

## 2012-04-22 NOTE — Anesthesia Postprocedure Evaluation (Signed)
  Anesthesia Post-op Note  Patient: Kyle Valenzuela  Procedure(s) Performed: Procedure(s) (LRB): ARTERIOVENOUS (AV) FISTULA CREATION (Right)  Patient Location: PACU  Anesthesia Type: General  Level of Consciousness: awake  Airway and Oxygen Therapy: Patient Spontanous Breathing  Post-op Pain: mild  Post-op Assessment: Post-op Vital signs reviewed, Patient's Cardiovascular Status Stable, Respiratory Function Stable, Patent Airway, No signs of Nausea or vomiting and Pain level controlled  Post-op Vital Signs: stable  Complications: No apparent anesthesia complications

## 2012-04-22 NOTE — Discharge Instructions (Signed)
     Instructions Following General Anesthetic, Adult A nurse specialized in giving anesthesia (anesthetist) or a doctor specialized in giving anesthesia (anesthesiologist) gave you a medicine that made you sleep while a procedure was performed. For as long as 24 hours following this procedure, you may feel:  Dizzy.   Weak.   Drowsy.  AFTER THE PROCEDURE After surgery, you will be taken to the recovery area where a nurse will monitor your progress. You will be allowed to go home when you are awake, stable, taking fluids well, and without complications. For the first 24 hours following an anesthetic:  Have a responsible person with you.   Do not drive a car. If you are alone, do not take public transportation.   Do not drink alcohol.   Do not take medicine that has not been prescribed by your caregiver.   Do not sign important papers or make important decisions.   You may resume normal diet and activities as directed.   Change bandages (dressings) as directed.   Only take over-the-counter or prescription medicines for pain, discomfort, or fever as directed by your caregiver.  If you have questions or problems that seem related to the anesthetic, call the hospital and ask for the anesthetist or anesthesiologist on call. SEEK IMMEDIATE MEDICAL CARE IF:   You develop a rash.   You have difficulty breathing.   You have chest pain.   You develop any allergic problems.  Document Released: 03/16/2001 Document Revised: 11/27/2011 Document Reviewed: 10/25/2007 [facility] Patient Information 2012 [place].

## 2012-04-22 NOTE — H&P (Signed)
[facility]  Patient name: Kyle Valenzuela MRN: [id number] DOB: 1961/08/22 Sex: male  Referred by: Dr. Lowanda Foster  Reason for referral:  Chief Complaint   Patient presents with   .  New Evaluation     Eval for access placement for dialysis - Ref. Dr. Dustin Flock    HISTORY OF PRESENT ILLNESS:  The patient comes in today for evaluation of dialysis access. He is not yet on dialysis. He is right-handed. The patient's renal insufficiency a secondary to hypertension. He does not have signs of uremia at this time.  The patient suffers from schizophrenia and is in a group home. He also has a history of COPD secondary to tobacco abuse.  Past Medical History   Diagnosis  Date   .  Chronic kidney disease    .  Hypertension    .  Schizophrenia    .  COPD (chronic obstructive pulmonary disease)    .  Hyperglycemia     History reviewed. No pertinent past surgical history.  History    Social History   .  Marital Status:  Single     Spouse Name:  N/A     Number of Children:  N/A   .  Years of Education:  N/A    Occupational History   .  Not on file.    Social History Main Topics   .  Smoking status:  Current Everyday Smoker -- 5.0 packs/day     Types:  Cigarettes   .  Smokeless tobacco:  Not on file   .  Alcohol Use:  No   .  Drug Use:  No   .  Sexually Active:  Not on file    Other Topics  Concern   .  Not on file    Social History Narrative   .  No narrative on file    History reviewed. No pertinent family history.  Allergies as of 03/01/2012   .  (No Known Allergies)    Current Outpatient Prescriptions on File Prior to Visit   Medication  Sig  Dispense  Refill   .  ALPRAZolam (XANAX) 1 MG tablet  Take 1 mg by mouth at bedtime as needed.     Marland Kitchen  amLODipine (NORVASC) 10 MG tablet  Take 10 mg by mouth daily.     .  carvedilol (COREG) 6.25 MG tablet  Take 6.25 mg by mouth 2 (two) times daily with a meal.     .  divalproex (DEPAKOTE) 500 MG DR tablet   Take 500 mg by mouth 2 (two) times daily.     .  hydrOXYzine (ATARAX/VISTARIL) 50 MG tablet  Take 50 mg by mouth 3 (three) times daily as needed.     Marland Kitchen  OLANZapine (ZYPREXA) 10 MG tablet  Take 10 mg by mouth daily.     Marland Kitchen  OLANZapine (ZYPREXA) 15 MG tablet  Take 15 mg by mouth at bedtime.     .  traZODone (DESYREL) 150 MG tablet  Take 150 mg by mouth at bedtime.      REVIEW OF SYSTEMS:  No chest pain no shortness of breath no neurologic symptoms. Positive for depression all of systems negative as documented in the encounter for  PHYSICAL EXAMINATION:  General: The patient appears their stated age. Vital signs are BP 129/82  Pulse 80  Resp 16  Ht 5\' 8"  (1.727 m)  Wt 180 lb (81.647 kg)  BMI 27.37 kg/m2  SpO2 99%  HEENT: No gross abnormalities  Pulmonary: Respirations are non-labored  Abdomen: Soft and non-tender  Musculoskeletal: There are no major deformities.  Neurologic: No focal weakness or paresthesias are detected,  Skin: There are no ulcer or rashes noted.  Psychiatric: The patient has normal affect.  Cardiovascular: Palpable right radial and brachial pulse  Diagnostic Studies:  Vein mapping reveals adequate right cephalic vein it measures 0.3 in the distal forearm and narrows down to 0.26 in the midforearm and from there more proximal it is greater than 0.37  Outside Studies/Documentation  Historical records were reviewed. They showed stage chronic renal insufficiency  Medication Changes:  None  Assessment:  Chronic renal insufficiency  Plan:  I had a lengthy discussion with the patient regarding dialysis access. I think he is a good candidate for a right radiocephalic fistula. We discussed the risks of non-maturity and the risk of steal syndrome.    Eldridge Abrahams, M.D.  [facility] [place]  Office: [phone]  Pager: [phone]

## 2012-04-23 ENCOUNTER — Other Ambulatory Visit: Payer: Self-pay | Admitting: *Deleted

## 2012-04-23 ENCOUNTER — Telehealth: Payer: Self-pay | Admitting: Surgery

## 2012-04-23 DIAGNOSIS — N186 End stage renal disease: Secondary | ICD-10-CM

## 2012-04-23 DIAGNOSIS — T82598A Other mechanical complication of other cardiac and vascular devices and implants, initial encounter: Secondary | ICD-10-CM

## 2012-04-23 NOTE — Telephone Encounter (Signed)
LVM @ [facility] regarding patients appointment on 06/07/12 @ 1200 with [facility]. Sent letter to home address, dpm

## 2012-04-23 NOTE — Telephone Encounter (Signed)
Message copied by Gena Fray on Fri Apr 23, 2012  2:57 PM ------      Message from: Alfonso Patten      Created: Fri Apr 23, 2012 10:32 AM       BE CAREFUL THERE ARE 2 PTS ON HERE WITH ORDERS EACH      ----- Message -----         From: Serafina Mitchell, MD         Sent: 04/22/2012   9:47 PM           To: Milus Mallick, RN            04/22/2012,             Right radio-cephalic fistula            Rollene Fare was the assistant            Follow up in 6 weeks with fistula ultrasound                        Brigitte Pulse [id number]            Left upper arm fistula.  Rollene Fare was assistant      Follow up in 4-6 weeks with fistula u/s

## 2012-04-26 ENCOUNTER — Encounter (HOSPITAL_COMMUNITY): Payer: Self-pay | Admitting: Surgery

## 2012-05-28 ENCOUNTER — Encounter: Payer: Self-pay | Admitting: Surgery

## 2012-05-31 ENCOUNTER — Ambulatory Visit: Payer: Medicare Other | Admitting: Surgery

## 2012-05-31 ENCOUNTER — Encounter (INDEPENDENT_AMBULATORY_CARE_PROVIDER_SITE_OTHER): Payer: Medicare Other | Admitting: *Deleted

## 2012-05-31 DIAGNOSIS — T82598A Other mechanical complication of other cardiac and vascular devices and implants, initial encounter: Secondary | ICD-10-CM

## 2012-05-31 DIAGNOSIS — N186 End stage renal disease: Secondary | ICD-10-CM

## 2012-05-31 DIAGNOSIS — N184 Chronic kidney disease, stage 4 (severe): Secondary | ICD-10-CM

## 2012-06-07 ENCOUNTER — Ambulatory Visit: Payer: Medicare Other | Admitting: Surgery

## 2012-06-07 NOTE — Procedures (Unsigned)
VASCULAR LAB EXAM  INDICATION:  Followup right arm dialysis access placement  HISTORY: Chronic kidney disease stage IV. Right radiocephalic AV fistula placed 04/22/2012 by Dr. Trula Slade. Diabetes: Cardiac: Hypertension:  Yes  EXAM:  Patent right radiocephalic AV fistula with 2 branches noted in the forearm measuring 0.20 and 0.24 cm. There was a velocity of 676 cm/s noted at the AVF.  IMPRESSION:  Patent right radiocephalic arteriovenous fistula with depth, diameter, branch and velocity measurements all shown on the following worksheet.  ___________________________________________ V. Leia Alf, MD  EM/MEDQ  D:  05/31/2012  T:  05/31/2012  Job:  [id number]

## 2012-06-11 ENCOUNTER — Encounter: Payer: Self-pay | Admitting: Surgery

## 2012-06-14 ENCOUNTER — Ambulatory Visit: Payer: Medicare Other | Admitting: Surgery

## 2012-07-02 ENCOUNTER — Encounter: Payer: Self-pay | Admitting: Surgery

## 2012-07-05 ENCOUNTER — Ambulatory Visit: Payer: Medicare Other | Admitting: Surgery

## 2012-07-12 ENCOUNTER — Other Ambulatory Visit: Payer: Self-pay | Admitting: *Deleted

## 2012-07-12 DIAGNOSIS — Z4931 Encounter for adequacy testing for hemodialysis: Secondary | ICD-10-CM

## 2012-07-12 DIAGNOSIS — N186 End stage renal disease: Secondary | ICD-10-CM

## 2012-07-16 ENCOUNTER — Encounter: Payer: Self-pay | Admitting: Surgery

## 2012-07-19 ENCOUNTER — Ambulatory Visit (INDEPENDENT_AMBULATORY_CARE_PROVIDER_SITE_OTHER): Payer: Medicare Other | Admitting: Surgery

## 2012-07-19 ENCOUNTER — Encounter (HOSPITAL_COMMUNITY): Payer: Self-pay | Admitting: Respiratory Therapy

## 2012-07-19 ENCOUNTER — Encounter: Payer: Self-pay | Admitting: Surgery

## 2012-07-19 ENCOUNTER — Encounter (INDEPENDENT_AMBULATORY_CARE_PROVIDER_SITE_OTHER): Payer: Medicare Other | Admitting: *Deleted

## 2012-07-19 VITALS — BP 106/69 | HR 66 | Temp 97.7°F | Ht 68.0 in | Wt 183.0 lb

## 2012-07-19 DIAGNOSIS — N186 End stage renal disease: Secondary | ICD-10-CM

## 2012-07-19 DIAGNOSIS — Z4931 Encounter for adequacy testing for hemodialysis: Secondary | ICD-10-CM

## 2012-07-19 NOTE — Progress Notes (Signed)
The patient is status post right radiocephalic fistula creation on 04/22/2012. He is here today for his first postoperative visit. He has no complaints of steal syndrome. He does not have any numbness in his arm. On examination I can't palpate the fistula however it does appear somewhat faint.  Ultrasound of the fistula revealed diameter measurements from 0.29 in the distal forearm 20.5 in the proximal brachium. Depth measurements ranged from 0.30-0.47. There are multiple branches on the fistula that are greater than 3 mm. The largest branches at the antecubital crease. This measures 6 mm. There are 3 additional branches in the forearm that are greater than 3 mm in diameter.  I believe the difficulty with maturation from this fistula is due to the multiple branches. I discussed going to the operating room to ligate these. This will hopefully be able to be accomplished through 2 incisions one in the mid forearm and one near the antecubital crease. Potentially there are 4 branches to ligate. I will have him come to the hospital for this procedure on Friday, August 9.

## 2012-07-21 ENCOUNTER — Other Ambulatory Visit: Payer: Self-pay

## 2012-07-26 NOTE — Procedures (Unsigned)
VASCULAR LAB EXAM  INDICATION:  End-stage renal disease, encounter for adequacy testing for hemodialysis.  HISTORY: Diabetes: Cardiac: Hypertension:  EXAM:  Right radiocephalic AV fistula duplex.  IMPRESSION: 1. Patent right radiocephalic arteriovenous fistula with no internal     vessel narrowing noted.  Maximum velocity of 324 cm/s noted in the     distal forearm level outflow vein. 2. Mostly retrograde, bidirectional flow noted in the right radial     artery. 3. Depth, diameter, velocity, and patent branch diameter measurements     are noted on the attached worksheet.  ___________________________________________ V. Leia Alf, MD  CH/MEDQ  D:  07/22/2012  T:  07/22/2012  Job:  [id number]

## 2012-07-27 ENCOUNTER — Encounter (HOSPITAL_COMMUNITY)
Admission: RE | Admit: 2012-07-27 | Discharge: 2012-07-27 | Disposition: A | Discharge status: Home / Self Care | Payer: Medicare Other | Source: Ambulatory Visit | Attending: Surgery | Admitting: Surgery

## 2012-07-27 ENCOUNTER — Encounter (HOSPITAL_COMMUNITY): Payer: Self-pay

## 2012-07-27 LAB — SURGICAL PCR SCREEN
MRSA, PCR: NEGATIVE
Staphylococcus aureus: NEGATIVE

## 2012-07-27 NOTE — Pre-Procedure Instructions (Signed)
[place]  07/27/2012   Your procedure is scheduled on: Friday, August 9th.  Report to [place] at 6:55 AM.  Call this number if you have problems the morning of surgery: [phone]   Remember:   Do not eat food or drink any liquid :After Midnight.     Take these medicines the morning of surgery with A SIP OF WATER: Amolodipine (Norvasc), Carvedilol (Coreg), Divalproex (Depakote), Diazepam (Valium).  Albubterol Inhaler if needed, and bring Albuterol in with you. May take Acetaminophen (Tylenol) if needed.   Do not wear jewelry, make-up or nail polish.  Do not wear lotions, powders, or perfumes. You may wear deodorant.  Do not shave 48 hours prior to surgery. Men may shave face and neck.  Do not bring valuables to the hospital.  Contacts, dentures or bridgework may not be worn into surgery.  Leave suitcase in the car. After surgery it may be brought to your room.  For patients admitted to the hospital, checkout time is 11:00 AM the day of discharge.   Patients discharged the day of surgery will not be allowed to drive home.  Name and phone number of your driver:___________  Special Instructions: CHG Shower Use Special Wash: 1/2 bottle night before surgery and 1/2 bottle morning of surgery.   Please read over the following fact sheets that you were given: Pain Booklet, Coughing and Deep Breathing and Surgical Site Infection Prevention

## 2012-07-29 MED ORDER — SODIUM CHLORIDE 0.9 % IV SOLN
INTRAVENOUS | Status: DC
  Administered 2012-07-30: 09:00:00 via INTRAVENOUS

## 2012-07-29 MED ORDER — CEFAZOLIN SODIUM-DEXTROSE 2-3 GM-% IV SOLR
2.0000 g | Freq: Once | INTRAVENOUS | Status: AC
  Administered 2012-07-30: 2 g via INTRAVENOUS
  Filled 2012-07-29: qty 50

## 2012-07-30 ENCOUNTER — Encounter (HOSPITAL_COMMUNITY): Payer: Self-pay | Admitting: Anesthesiology

## 2012-07-30 ENCOUNTER — Encounter (HOSPITAL_COMMUNITY): Payer: Self-pay | Admitting: Certified Registered Nurse Anesthetist

## 2012-07-30 ENCOUNTER — Ambulatory Visit (HOSPITAL_COMMUNITY)
Admission: RE | Admit: 2012-07-30 | Discharge: 2012-07-30 | Disposition: A | Discharge status: Home / Self Care | Payer: Medicare Other | Source: Ambulatory Visit | Attending: Surgery | Admitting: Surgery

## 2012-07-30 ENCOUNTER — Ambulatory Visit (HOSPITAL_COMMUNITY): Payer: Medicare Other | Admitting: Anesthesiology

## 2012-07-30 ENCOUNTER — Encounter (HOSPITAL_COMMUNITY)
Admission: RE | Disposition: A | Discharge status: Home / Self Care | Payer: Self-pay | Source: Ambulatory Visit | Attending: Surgery

## 2012-07-30 ENCOUNTER — Other Ambulatory Visit: Payer: Self-pay

## 2012-07-30 DIAGNOSIS — N186 End stage renal disease: Secondary | ICD-10-CM

## 2012-07-30 DIAGNOSIS — J4489 Other specified chronic obstructive pulmonary disease: Secondary | ICD-10-CM | POA: Insufficient documentation

## 2012-07-30 DIAGNOSIS — F209 Schizophrenia, unspecified: Secondary | ICD-10-CM | POA: Insufficient documentation

## 2012-07-30 DIAGNOSIS — F319 Bipolar disorder, unspecified: Secondary | ICD-10-CM | POA: Insufficient documentation

## 2012-07-30 DIAGNOSIS — I120 Hypertensive chronic kidney disease with stage 5 chronic kidney disease or end stage renal disease: Secondary | ICD-10-CM | POA: Insufficient documentation

## 2012-07-30 DIAGNOSIS — F17200 Nicotine dependence, unspecified, uncomplicated: Secondary | ICD-10-CM | POA: Insufficient documentation

## 2012-07-30 DIAGNOSIS — J449 Chronic obstructive pulmonary disease, unspecified: Secondary | ICD-10-CM | POA: Insufficient documentation

## 2012-07-30 DIAGNOSIS — T82598A Other mechanical complication of other cardiac and vascular devices and implants, initial encounter: Secondary | ICD-10-CM | POA: Insufficient documentation

## 2012-07-30 DIAGNOSIS — Y832 Surgical operation with anastomosis, bypass or graft as the cause of abnormal reaction of the patient, or of later complication, without mention of misadventure at the time of the procedure: Secondary | ICD-10-CM | POA: Insufficient documentation

## 2012-07-30 DIAGNOSIS — T82898A Other specified complication of vascular prosthetic devices, implants and grafts, initial encounter: Secondary | ICD-10-CM

## 2012-07-30 HISTORY — PX: AV FISTULA PLACEMENT: SHX1204

## 2012-07-30 LAB — POCT I-STAT 4, (NA,K, GLUC, HGB,HCT)
Hemoglobin: 13.6 g/dL (ref 13.0–17.0)
Sodium: 142 mEq/L (ref 135–145)

## 2012-07-30 SURGERY — LIGATION OF COMPETING BRANCHES OF ARTERIOVENOUS FISTULA
Anesthesia: Monitor Anesthesia Care | Site: Arm Lower | Laterality: Right | Wound class: Clean

## 2012-07-30 MED ORDER — MIDAZOLAM HCL 2 MG/2ML IJ SOLN: 0.5000 mg | Freq: Once | INTRAMUSCULAR | Status: DC | PRN

## 2012-07-30 MED ORDER — LIDOCAINE HCL (PF) 1 % IJ SOLN
INTRAMUSCULAR | Status: AC
  Filled 2012-07-30: qty 30

## 2012-07-30 MED ORDER — FENTANYL CITRATE 0.05 MG/ML IJ SOLN
INTRAMUSCULAR | Status: DC | PRN
  Administered 2012-07-30 (×2): 50 ug via INTRAVENOUS

## 2012-07-30 MED ORDER — 0.9 % SODIUM CHLORIDE (POUR BTL) OPTIME
TOPICAL | Status: DC | PRN
  Administered 2012-07-30: 1000 mL

## 2012-07-30 MED ORDER — MEPERIDINE HCL 25 MG/ML IJ SOLN: 6.2500 mg | INTRAMUSCULAR | Status: DC | PRN

## 2012-07-30 MED ORDER — FENTANYL CITRATE 0.05 MG/ML IJ SOLN: 25.0000 ug | INTRAMUSCULAR | Status: DC | PRN

## 2012-07-30 MED ORDER — LIDOCAINE-EPINEPHRINE (PF) 1 %-1:200000 IJ SOLN
INTRAMUSCULAR | Status: DC | PRN
  Administered 2012-07-30: 6 mL via INTRADERMAL

## 2012-07-30 MED ORDER — LIDOCAINE-EPINEPHRINE (PF) 1 %-1:200000 IJ SOLN
INTRAMUSCULAR | Status: AC
  Filled 2012-07-30: qty 10

## 2012-07-30 MED ORDER — PROMETHAZINE HCL 25 MG/ML IJ SOLN: 6.2500 mg | INTRAMUSCULAR | Status: DC | PRN

## 2012-07-30 MED ORDER — OXYCODONE HCL 5 MG PO TABS: 5.0000 mg | ORAL_TABLET | Freq: Four times a day (QID) | ORAL | Status: AC | PRN

## 2012-07-30 MED ORDER — SODIUM CHLORIDE 0.9 % IV SOLN
INTRAVENOUS | Status: DC | PRN
  Administered 2012-07-30: 11:00:00 via INTRAVENOUS

## 2012-07-30 MED ORDER — EPHEDRINE SULFATE 50 MG/ML IJ SOLN
INTRAMUSCULAR | Status: DC | PRN
  Administered 2012-07-30 (×2): 10 mg via INTRAVENOUS

## 2012-07-30 MED ORDER — PROPOFOL 10 MG/ML IV EMUL
INTRAVENOUS | Status: DC | PRN
  Administered 2012-07-30: 25 ug/kg/min via INTRAVENOUS

## 2012-07-30 SURGICAL SUPPLY — 40 items
ADH SKN CLS APL DERMABOND .7 (GAUZE/BANDAGES/DRESSINGS) ×1
CANISTER SUCTION 2500CC (MISCELLANEOUS) ×2 IMPLANT
CLIP TI MEDIUM 6 (CLIP) ×2 IMPLANT
CLIP TI WIDE RED SMALL 6 (CLIP) ×2 IMPLANT
CLOTH BEACON ORANGE TIMEOUT ST (SAFETY) ×2 IMPLANT
COVER PROBE W GEL 5X96 (DRAPES) ×2 IMPLANT
COVER SURGICAL LIGHT HANDLE (MISCELLANEOUS) ×2 IMPLANT
DERMABOND ADVANCED (GAUZE/BANDAGES/DRESSINGS) ×1
DERMABOND ADVANCED .7 DNX12 (GAUZE/BANDAGES/DRESSINGS) ×1 IMPLANT
ELECT REM PT RETURN 9FT ADLT (ELECTROSURGICAL) ×2
ELECTRODE REM PT RTRN 9FT ADLT (ELECTROSURGICAL) ×1 IMPLANT
GEL ULTRASOUND 20GR AQUASONIC (MISCELLANEOUS) ×1 IMPLANT
GLOVE BIOGEL PI IND STRL 6.5 (GLOVE) IMPLANT
GLOVE BIOGEL PI IND STRL 7.5 (GLOVE) ×1 IMPLANT
GLOVE BIOGEL PI INDICATOR 6.5 (GLOVE) ×1
GLOVE BIOGEL PI INDICATOR 7.5 (GLOVE) ×1
GLOVE ECLIPSE 6.5 STRL STRAW (GLOVE) ×1 IMPLANT
GLOVE SURG SS PI 7.5 STRL IVOR (GLOVE) ×2 IMPLANT
GOWN PREVENTION PLUS XXLARGE (GOWN DISPOSABLE) ×2 IMPLANT
GOWN STRL NON-REIN LRG LVL3 (GOWN DISPOSABLE) ×4 IMPLANT
HEMOSTAT SNOW SURGICEL 2X4 (HEMOSTASIS) IMPLANT
HEMOSTAT SURGICEL 2X14 (HEMOSTASIS) IMPLANT
KIT BASIN OR (CUSTOM PROCEDURE TRAY) ×2 IMPLANT
KIT ROOM TURNOVER OR (KITS) ×2 IMPLANT
NS IRRIG 1000ML POUR BTL (IV SOLUTION) ×2 IMPLANT
PACK CV ACCESS (CUSTOM PROCEDURE TRAY) ×2 IMPLANT
PAD ARMBOARD 7.5X6 YLW CONV (MISCELLANEOUS) ×4 IMPLANT
STAPLER VISISTAT 35W (STAPLE) IMPLANT
SUT ETHILON 3 0 PS 1 (SUTURE) IMPLANT
SUT PROLENE 6 0 BV (SUTURE) IMPLANT
SUT SILK 0 TIES 10X30 (SUTURE) IMPLANT
SUT VIC AB 3-0 SH 27 (SUTURE) ×2
SUT VIC AB 3-0 SH 27X BRD (SUTURE) ×1 IMPLANT
SUT VICRYL 4-0 PS2 18IN ABS (SUTURE) ×1 IMPLANT
SWAB COLLECTION DEVICE MRSA (MISCELLANEOUS) IMPLANT
TOWEL OR 17X24 6PK STRL BLUE (TOWEL DISPOSABLE) ×2 IMPLANT
TOWEL OR 17X26 10 PK STRL BLUE (TOWEL DISPOSABLE) ×2 IMPLANT
TUBE ANAEROBIC SPECIMEN COL (MISCELLANEOUS) IMPLANT
UNDERPAD 30X30 INCONTINENT (UNDERPADS AND DIAPERS) ×2 IMPLANT
WATER STERILE IRR 1000ML POUR (IV SOLUTION) ×2 IMPLANT

## 2012-07-30 NOTE — Op Note (Signed)
[facility]ists of [facility]  Patient name: Kyle Valenzuela MRN: [id number] DOB: 07-23-1961 Sex: male  07/30/2012 Pre-operative Diagnosis: Chronic renal insufficiency Post-operative diagnosis:  Same Surgeon:  Eldridge Abrahams Assistants:  None Procedure:   Branch ligation, right AV fistula x2 Anesthesia:  MAC Blood Loss:  See anesthesia record Specimens:  None  Findings:  Cephalic vein appeared adequate on ultrasound however upon inspection it had gone into severe spasm and did not appear to be a healthy pain. And then ligating 2 branches. There remained a faint thrill within the fistula.  Indications:  The patient has previously undergone right radiocephalic fistula. This has not matured as expected was found to have several branches on ultrasound that served as the likely source for non-maturation. He comes in to have these ligated.  Procedure:  The patient was identified in the holding area and taken to [place] 12  The patient was then placed supine on the table. IV sedation anesthesia was administered.  The patient was prepped and draped in the usual sterile fashion.  A time out was called and antibiotics were administered.  Ultrasound was used to map the cephalic vein. I identified several large branches that were  The ones identified in the preoperative visit. I felt that the largest branch was near the antecubital crease and then there was another area where there appeared to be a larger branch. One percent lidocaine was used for local anesthesia. 2 incisions were made which corresponded with the branch location. I dissected out the cephalic vein. I was surprised to see how small it had become. It was really about 2 mm on inspection. Identified the branches and each location the largest was near the antecubital crease. These were ligated with 2-0 silk ties. I then reevaluated the vein was ultrasound to make sure that I was looking at the appropriate vein and I was able to  correlate this with ultrasound it was the cephalic vein fistula were the branches had been ligated. At this point I did not see any future interventions it needed to be done and therefore closed the skin with 3-0 Vicryl in 2 layers. Dermabond was placed on the incision.   Disposition:  To PACU in stable condition.   Theotis Burrow, M.D. [facility] [place] Office: [phone] Pager:  [phone]

## 2012-07-30 NOTE — Interval H&P Note (Signed)
History and Physical Interval Note:  07/30/2012 8:07 AM  Kyle Valenzuela  has presented today for surgery, with the diagnosis of End Stage Renal Disease Complications of Arteriovenous Fistula  The various methods of treatment have been discussed with the patient and family. After consideration of risks, benefits and other options for treatment, the patient has consented to  Procedure(s) (LRB): LIGATION OF COMPETING BRANCHES OF ARTERIOVENOUS FISTULA (Right) as a surgical intervention .  The patient's history has been reviewed, patient examined, no change in status, stable for surgery.  I have reviewed the patient's chart and labs.  Questions were answered to the patient's satisfaction.     Stuart Guillen IV, V. WELLS

## 2012-07-30 NOTE — Preoperative (Signed)
Beta Blockers   Reason not to administer Beta Blockers:Not Applicable 

## 2012-07-30 NOTE — Transfer of Care (Signed)
Immediate Anesthesia Transfer of Care Note  Patient: Kyle Valenzuela  Procedure(s) Performed: Procedure(s) (LRB): LIGATION OF COMPETING BRANCHES OF ARTERIOVENOUS FISTULA (Right)  Patient Location: PACU  Anesthesia Type: MAC  Level of Consciousness: awake, alert , oriented and patient cooperative  Airway & Oxygen Therapy: Patient Spontanous Breathing and Patient connected to nasal cannula oxygen  Post-op Assessment: Report given to PACU RN, Post -op Vital signs reviewed and stable and Patient moving all extremities  Post vital signs: Reviewed and stable  Complications: No apparent anesthesia complications

## 2012-07-30 NOTE — Anesthesia Postprocedure Evaluation (Signed)
  Anesthesia Post-op Note  Patient: Kyle Valenzuela  Procedure(s) Performed: Procedure(s) (LRB): LIGATION OF COMPETING BRANCHES OF ARTERIOVENOUS FISTULA (Right)  Patient Location: PACU  Anesthesia Type: MAC  Level of Consciousness: awake, alert  and oriented  Airway and Oxygen Therapy: Patient Spontanous Breathing  Post-op Pain: none  Post-op Assessment: Post-op Vital signs reviewed, Patient's Cardiovascular Status Stable, Respiratory Function Stable, Patent Airway, No signs of Nausea or vomiting and Pain level controlled  Post-op Vital Signs: Reviewed and stable  Complications: No apparent anesthesia complications

## 2012-07-30 NOTE — Discharge Instructions (Signed)
Instructions Following General Anesthetic, Adult A nurse specialized in giving anesthesia (anesthetist) or a doctor specialized in giving anesthesia (anesthesiologist) gave you a medicine that made you sleep while a procedure was performed. For as long as 24 hours following this procedure, you may feel:  Dizzy.   Weak.   Drowsy.  AFTER THE PROCEDURE After surgery, you will be taken to the recovery area where a nurse will monitor your progress. You will be allowed to go home when you are awake, stable, taking fluids well, and without complications. For the first 24 hours following an anesthetic:  Have a responsible person with you.   Do not drive a car. If you are alone, do not take public transportation.   Do not drink alcohol.   Do not take medicine that has not been prescribed by your caregiver.   Do not sign important papers or make important decisions.   You may resume normal diet and activities as directed.   Change bandages (dressings) as directed.   Only take over-the-counter or prescription medicines for pain, discomfort, or fever as directed by your caregiver.  If you have questions or problems that seem related to the anesthetic, call the hospital and ask for the anesthetist or anesthesiologist on call. SEEK IMMEDIATE MEDICAL CARE IF:   You develop a rash.   You have difficulty breathing.   You have chest pain.   You develop any allergic problems.  Document Released: 03/16/2001 Document Revised: 11/27/2011 Document Reviewed: 10/25/2007 [facility] Patient Information 2012 [place].

## 2012-07-30 NOTE — Anesthesia Preprocedure Evaluation (Signed)
Anesthesia Evaluation  Patient identified by MRN, date of birth, ID band Patient awake    Reviewed: Allergy & Precautions, H&P , NPO status , Patient's Chart, lab work & pertinent test results, reviewed documented beta blocker date and time   History of Anesthesia Complications Negative for: history of anesthetic complications  Airway Mallampati: II TM Distance: >3 FB Neck ROM: Full    Dental  (+) Edentulous Upper and Edentulous Lower   Pulmonary COPDCurrent Smoker,  breath sounds clear to auscultation- rhonchi  Pulmonary exam normal       Cardiovascular hypertension, Pt. on medications and Pt. on home beta blockers Rhythm:Regular Rate:Normal     Neuro/Psych PSYCHIATRIC DISORDERS Anxiety Bipolar Disorder Schizophrenia    GI/Hepatic negative GI ROS, Neg liver ROS,   Endo/Other  negative endocrine ROS  Renal/GU ESRFRenal disease (no dialysis yet, K+ 4.5)     Musculoskeletal   Abdominal   Peds  Hematology   Anesthesia Other Findings   Reproductive/Obstetrics                           Anesthesia Physical Anesthesia Plan  ASA: III  Anesthesia Plan: MAC   Post-op Pain Management:    Induction: Intravenous  Airway Management Planned: Simple Face Mask  Additional Equipment:   Intra-op Plan:   Post-operative Plan:   Informed Consent: I have reviewed the patients History and Physical, chart, labs and discussed the procedure including the risks, benefits and alternatives for the proposed anesthesia with the patient or authorized representative who has indicated his/her understanding and acceptance.     Plan Discussed with: CRNA and Surgeon  Anesthesia Plan Comments: (Plan routine monitors,  MAC)        Anesthesia Quick Evaluation

## 2012-07-30 NOTE — Progress Notes (Signed)
Thrill and bruit and present in right arm

## 2012-07-30 NOTE — H&P (View-Only) (Signed)
The patient is status post right radiocephalic fistula creation on 04/22/2012. He is here today for his first postoperative visit. He has no complaints of steal syndrome. He does not have any numbness in his arm. On examination I can't palpate the fistula however it does appear somewhat faint.  Ultrasound of the fistula revealed diameter measurements from 0.29 in the distal forearm 20.5 in the proximal brachium. Depth measurements ranged from 0.30-0.47. There are multiple branches on the fistula that are greater than 3 mm. The largest branches at the antecubital crease. This measures 6 mm. There are 3 additional branches in the forearm that are greater than 3 mm in diameter.  I believe the difficulty with maturation from this fistula is due to the multiple branches. I discussed going to the operating room to ligate these. This will hopefully be able to be accomplished through 2 incisions one in the mid forearm and one near the antecubital crease. Potentially there are 4 branches to ligate. I will have him come to the hospital for this procedure on Friday, August 9.

## 2012-08-03 ENCOUNTER — Telehealth: Payer: Self-pay | Admitting: *Deleted

## 2012-08-03 NOTE — Telephone Encounter (Signed)
Kyle Valenzuela at [facility] called for the patient to report a small area of redness adjacent to his incision. Patient is 4 days postop right AVF branch ligation by Dr. Trula Slade.  He is afebrile and reports no pain, wound drainage or separation. His nurse says the area appears to look bruised. Patient reassured and his nurse will call if he has any other problems mentioned above. His postop appt with Dr. Trula Slade is on 09-06-12.

## 2012-09-03 ENCOUNTER — Encounter: Payer: Self-pay | Admitting: Surgery

## 2012-09-06 ENCOUNTER — Encounter: Payer: Self-pay | Admitting: Surgery

## 2012-09-06 ENCOUNTER — Encounter (INDEPENDENT_AMBULATORY_CARE_PROVIDER_SITE_OTHER): Payer: Medicare Other | Admitting: *Deleted

## 2012-09-06 ENCOUNTER — Ambulatory Visit (INDEPENDENT_AMBULATORY_CARE_PROVIDER_SITE_OTHER): Payer: Medicare Other | Admitting: Surgery

## 2012-09-06 VITALS — BP 116/82 | HR 64 | Resp 16 | Ht 68.0 in | Wt 185.2 lb

## 2012-09-06 DIAGNOSIS — N186 End stage renal disease: Secondary | ICD-10-CM

## 2012-09-06 NOTE — Progress Notes (Signed)
The patient is back today for followup. He had a right radiocephalic fistula placed on 04/22/2012 and branch ligation x2 on 07/30/2012. There is a good thrill within the fistula. Ultrasound was performed today which shows the majority of the vein is greater than 0.4 cm in diameter. Distally it does get slightly smaller. There is no evidence of stenosis. At this point I do not believe any other interventions are required. He will followup appear invasive

## 2012-12-13 ENCOUNTER — Ambulatory Visit (INDEPENDENT_AMBULATORY_CARE_PROVIDER_SITE_OTHER): Payer: Medicare Other | Admitting: Surgery

## 2012-12-13 ENCOUNTER — Encounter (INDEPENDENT_AMBULATORY_CARE_PROVIDER_SITE_OTHER): Payer: Medicare Other | Admitting: *Deleted

## 2012-12-13 ENCOUNTER — Encounter: Payer: Self-pay | Admitting: Surgery

## 2012-12-13 VITALS — BP 116/79 | HR 88 | Resp 18 | Ht 68.0 in | Wt 183.0 lb

## 2012-12-13 DIAGNOSIS — N186 End stage renal disease: Secondary | ICD-10-CM

## 2012-12-13 DIAGNOSIS — Z01810 Encounter for preprocedural cardiovascular examination: Secondary | ICD-10-CM

## 2012-12-13 NOTE — Progress Notes (Signed)
Vascular and Vein Specialist of [facility]   Patient name: Kyle Valenzuela MRN: [id number] DOB: December 07, 1961 Sex: male     Chief Complaint  Patient presents with  . New Evaluation    EVAL FOR CLOTTED AVF    HISTORY OF PRESENT ILLNESS: The patient is back today for followup. He is status post right radiocephalic fistula on [id number]. He went on to have branch ligation in August of 2013. His fistula has now occluded. He is not yet on dialysis.  Past Medical History  Diagnosis Date  . Chronic kidney disease   . COPD (chronic obstructive pulmonary disease)   . Hyperglycemia   . Schizophrenia   . Hypertension     Past Surgical History  Procedure Date  . Tonsillectomy     as a child  . Multiple tooth extractions   . Av fistula placement 04/22/2012    Procedure: ARTERIOVENOUS (AV) FISTULA CREATION;  Surgeon: Serafina Mitchell, MD;  Location: [place];  Service: Vascular;  Laterality: Right;  . Av fistula placement 07/30/12    Right Ligation of competing branches  AVF    History   Social History  . Marital Status: Single    Spouse Name: N/A    Number of Children: N/A  . Years of Education: N/A   Occupational History  . Not on file.   Social History Main Topics  . Smoking status: Current Every Day Smoker -- 0.5 packs/day    Types: Cigarettes  . Smokeless tobacco: Never Used     Comment: 7 cigs per day  . Alcohol Use: No  . Drug Use: No  . Sexually Active: Not on file   Other Topics Concern  . Not on file   Social History Narrative  . No narrative on file    No family history on file.  Allergies as of 12/13/2012  . (No Known Allergies)    Current Outpatient Prescriptions on File Prior to Visit  Medication Sig Dispense Refill  . acetaminophen (TYLENOL) 325 MG tablet Take 650 mg by mouth every 4 (four) hours as needed. For pain      . albuterol (PROVENTIL HFA;VENTOLIN HFA) 108 (90 BASE) MCG/ACT inhaler Inhale 2 puffs into the lungs every 4 (four) hours as needed. For  shortness of breath      . ALPRAZolam (XANAX) 1 MG tablet Take 1 mg by mouth 4 (four) times daily as needed.      Marland Kitchen amLODipine (NORVASC) 10 MG tablet Take 10 mg by mouth daily.      . carvedilol (COREG) 6.25 MG tablet Take 6.25 mg by mouth 2 (two) times daily with a meal.      . diazepam (VALIUM) 10 MG tablet Take 10 mg by mouth 3 (three) times daily.      . divalproex (DEPAKOTE) 500 MG DR tablet Take 500 mg by mouth 2 (two) times daily.      Marland Kitchen OLANZapine (ZYPREXA) 10 MG tablet Take 10 mg by mouth daily.      Marland Kitchen OLANZapine (ZYPREXA) 15 MG tablet Take by mouth at bedtime.       . traZODone (DESYREL) 150 MG tablet Take 150 mg by mouth at bedtime.         REVIEW OF SYSTEMS: No changes from prior visit  PHYSICAL EXAMINATION:   Vital signs are BP 116/79  Pulse 88  Resp 18  Ht 5\' 8"  (1.727 m)  Wt 183 lb (83.008 kg)  BMI 27.82 kg/m2 General: The patient appears their stated  age. HEENT:  No gross abnormalities Pulmonary:  Non labored breathing Musculoskeletal: There are no major deformities. Neurologic: No focal weakness or paresthesias are detected, Skin: There are no ulcer or rashes noted. Psychiatric: The patient has normal affect. Cardiovascular: No thrill and right radiocephalic   Diagnostic Studies Ultrasound reveals an occluded forearm fistula. The vein diameters in the upper arm range from 0.23 at the antecubital crease up to 0.32  Assessment: Chronic renal insufficiency Plan: I do not see any reason to try and proceed with thrombectomy of his right radiocephalic fistula. I have recommended proceeding with a right upper arm fistula. Again his vein size is marginal, but I would still recommend proceeding with a fistula. After discussing this, the patient states that he does not want dialysis. He states that he would rather die than have to receive dialysis. I told him that he would be to have to discuss this with his family and health care team. I would still recommend placing a  fistula in case he changes his mind. This is been scheduled as a right upper arm fistula to be performed in approximately one month  V. Annamarie Major IV, M.D. V[facility] and Vein Specialists of [place] Office: [phone] Pager:  [phone]

## 2013-01-03 ENCOUNTER — Other Ambulatory Visit: Payer: Self-pay

## 2013-01-07 ENCOUNTER — Encounter (HOSPITAL_COMMUNITY): Payer: Self-pay

## 2013-01-07 ENCOUNTER — Telehealth: Payer: Self-pay

## 2013-01-07 NOTE — Telephone Encounter (Signed)
Rec'd. phone call from Jeronimo Greaves, [employer] Representative, caregiver for patient.  Stated pt's sister is requesting to cancel his upcoming pre-op appt. and surgery, for permanent dialysis access.  Stated that the pt's. sister wants to discuss the need in more detail with the kidney doctor, before proceeding.  Stated the pt. has a lot of mental issues, and that facing this surgery will be very difficult for him.  Robin, pt's. Sister, spoke up on the phone, and confirmed she will call back to reschedule, after discussing pt's. condition with his kidney doctor at his next appt. In February.  John, caregiver, states the appt. Is scheduled 02/15/13 with Dr. Lowanda Foster, and that he will attempt to get appt. moved to an earlier date.  Procedure for right upper arm AVF will be cancelled until future date.

## 2013-01-10 ENCOUNTER — Encounter (HOSPITAL_COMMUNITY): Payer: Medicare Other

## 2013-01-14 ENCOUNTER — Ambulatory Visit: Admit: 2013-01-14 | Payer: Medicare Other | Admitting: Surgery

## 2013-01-14 SURGERY — ARTERIOVENOUS (AV) FISTULA CREATION
Anesthesia: Monitor Anesthesia Care | Site: Arm Upper | Laterality: Right

## 2014-11-23 ENCOUNTER — Emergency Department (HOSPITAL_COMMUNITY)
Admission: EM | Admit: 2014-11-23 | Discharge: 2014-11-23 | Disposition: A | Discharge status: Home / Self Care | Payer: Medicare Other | Attending: Emergency Medicine | Admitting: Emergency Medicine

## 2014-11-23 ENCOUNTER — Encounter (HOSPITAL_COMMUNITY): Payer: Self-pay | Admitting: Cardiology

## 2014-11-23 DIAGNOSIS — I129 Hypertensive chronic kidney disease with stage 1 through stage 4 chronic kidney disease, or unspecified chronic kidney disease: Secondary | ICD-10-CM | POA: Insufficient documentation

## 2014-11-23 DIAGNOSIS — N189 Chronic kidney disease, unspecified: Secondary | ICD-10-CM | POA: Insufficient documentation

## 2014-11-23 DIAGNOSIS — J449 Chronic obstructive pulmonary disease, unspecified: Secondary | ICD-10-CM | POA: Insufficient documentation

## 2014-11-23 DIAGNOSIS — Z008 Encounter for other general examination: Secondary | ICD-10-CM | POA: Diagnosis present

## 2014-11-23 DIAGNOSIS — F458 Other somatoform disorders: Secondary | ICD-10-CM | POA: Diagnosis not present

## 2014-11-23 DIAGNOSIS — Z79899 Other long term (current) drug therapy: Secondary | ICD-10-CM | POA: Diagnosis not present

## 2014-11-23 DIAGNOSIS — Z87891 Personal history of nicotine dependence: Secondary | ICD-10-CM | POA: Diagnosis not present

## 2014-11-23 DIAGNOSIS — F54 Psychological and behavioral factors associated with disorders or diseases classified elsewhere: Secondary | ICD-10-CM

## 2014-11-23 DIAGNOSIS — F2089 Other schizophrenia: Secondary | ICD-10-CM | POA: Diagnosis not present

## 2014-11-23 DIAGNOSIS — R631 Polydipsia: Secondary | ICD-10-CM

## 2014-11-23 LAB — BASIC METABOLIC PANEL
Anion gap: 14 (ref 5–15)
BUN: 36 mg/dL — AB (ref 6–23)
CO2: 26 mEq/L (ref 19–32)
CREATININE: 4.13 mg/dL — AB (ref 0.50–1.35)
Calcium: 9.2 mg/dL (ref 8.4–10.5)
Chloride: 101 mEq/L (ref 96–112)
GFR, EST AFRICAN AMERICAN: 17 mL/min — AB (ref >90)
GFR, EST NON AFRICAN AMERICAN: 15 mL/min — AB (ref >90)
Glucose, Bld: 100 mg/dL — ABNORMAL HIGH (ref 70–99)
POTASSIUM: 4.4 meq/L (ref 3.7–5.3)
Sodium: 141 mEq/L (ref 137–147)

## 2014-11-23 LAB — CBC WITH DIFFERENTIAL/PLATELET
BASOS ABS: 0 10*3/uL (ref 0.0–0.1)
BASOS PCT: 1 % (ref 0–1)
EOS ABS: 0.2 10*3/uL (ref 0.0–0.7)
Eosinophils Relative: 4 % (ref 0–5)
HCT: 37.3 % — ABNORMAL LOW (ref 39.0–52.0)
HEMOGLOBIN: 12.7 g/dL — AB (ref 13.0–17.0)
Lymphocytes Relative: 29 % (ref 12–46)
Lymphs Abs: 1.2 10*3/uL (ref 0.7–4.0)
MCH: 31.2 pg (ref 26.0–34.0)
MCHC: 34 g/dL (ref 30.0–36.0)
MCV: 91.6 fL (ref 78.0–100.0)
MONOS PCT: 7 % (ref 3–12)
Monocytes Absolute: 0.3 10*3/uL (ref 0.1–1.0)
NEUTROS PCT: 59 % (ref 43–77)
Neutro Abs: 2.5 10*3/uL (ref 1.7–7.7)
Platelets: 141 10*3/uL — ABNORMAL LOW (ref 150–400)
RBC: 4.07 MIL/uL — ABNORMAL LOW (ref 4.22–5.81)
RDW: 13 % (ref 11.5–15.5)
WBC: 4.2 10*3/uL (ref 4.0–10.5)

## 2014-11-23 LAB — URINALYSIS, ROUTINE W REFLEX MICROSCOPIC
BILIRUBIN URINE: NEGATIVE
Glucose, UA: NEGATIVE mg/dL
Ketones, ur: NEGATIVE mg/dL
Leukocytes, UA: NEGATIVE
Nitrite: NEGATIVE
PROTEIN: NEGATIVE mg/dL
Specific Gravity, Urine: <1.005 — ABNORMAL LOW (ref 1.005–1.030)
UROBILINOGEN UA: 0.2 mg/dL (ref 0.0–1.0)
pH: 5.5 (ref 5.0–8.0)

## 2014-11-23 LAB — CBG MONITORING, ED: Glucose-Capillary: 150 mg/dL — ABNORMAL HIGH (ref 70–99)

## 2014-11-23 LAB — URINE MICROSCOPIC-ADD ON

## 2014-11-23 LAB — VALPROIC ACID LEVEL: Valproic Acid Lvl: 51.4 ug/mL (ref 50.0–100.0)

## 2014-11-23 MED ORDER — LORAZEPAM 1 MG PO TABS
2.0000 mg | ORAL_TABLET | Freq: Once | ORAL | Status: AC
  Administered 2014-11-23: 2 mg via ORAL
  Filled 2014-11-23: qty 2

## 2014-11-23 MED ORDER — LORAZEPAM 2 MG PO TABS: ORAL_TABLET | ORAL | Status: DC

## 2014-11-23 NOTE — BHH Counselor (Signed)
Spoke with Dr. Jeneen Rinks who is concerned about the amount of water client is intaking daily. Dr. Nicole Cella recommends that the group home only allow him to drink a normal amount of water daily and seek outpatient treatment in the next couple of days to possibly reduce his zyprexa from 10mg  to 5mg .   Bedelia Person, M.S., LPCA, [facility] Licensed Professional Counselor Associate  Triage Specialist  [facility]  Therapeutic Triage Services Phone: [phone] Fax: [phone]

## 2014-11-23 NOTE — ED Notes (Signed)
Caregiver returned.  Pt following commands, calm and  Cooperative.

## 2014-11-23 NOTE — ED Notes (Addendum)
Pt has been aggressive times 2-3 weeks.  Resident at [facility].  Care taker called [facility] and they told them to come to the er for evaluation.  Also per caretaker pt has been drinking water excessively.

## 2014-11-23 NOTE — ED Notes (Signed)
MD at bedside. 

## 2014-11-23 NOTE — Discharge Instructions (Signed)
Please make an appointment at [facility] to discuss medication changes. Change Zyprexa to 5 mg at bedtime. (new Rx given). Prescription for Ativan at bedtime, and during the day as needed if hyperactive  Schizophrenia Schizophrenia is a mental illness. It may cause disturbed or disorganized thinking, speech, or behavior. People with schizophrenia have problems functioning in one or more areas of life: work, school, home, or relationships. People with schizophrenia are at increased risk for suicide, certain chronic physical illnesses, and unhealthy behaviors, such as smoking and drug use. People who have family members with schizophrenia are at higher risk of developing the illness. Schizophrenia affects men and women equally but usually appears at an earlier age (teenage or early adult years) in men.  SYMPTOMS The earliest symptoms are often subtle (prodrome) and may go unnoticed until the illness becomes more severe (first-break psychosis). Symptoms of schizophrenia may be continuous or may come and go in severity. Episodes often are triggered by major life events, such as family stress, college, [employer], marriage, pregnancy or child birth, divorce, or loss of a loved one. People with schizophrenia may see, hear, or feel things that do not exist (hallucinations). They may have false beliefs in spite of obvious proof to the contrary (delusions). Sometimes speech is incoherent or behavior is odd or withdrawn.  DIAGNOSIS Schizophrenia is diagnosed through an assessment by your caregiver. Your caregiver will ask questions about your thoughts, behavior, mood, and ability to function in daily life. Your caregiver may ask questions about your medical history and use of alcohol or drugs, including prescription medication. Your caregiver may also order blood tests and imaging exams. Certain medical conditions and substances can cause symptoms that resemble schizophrenia. Your caregiver may refer you to a  mental health specialist for evaluation. There are three major criterion for a diagnosis of schizophrenia:  Two or more of the following five symptoms are present for a month or longer:  Delusions. Often the delusions are that you are being attacked, harassed, cheated, persecuted or conspired against (persecutory delusions).  Hallucinations.   Disorganized speech that does not make sense to others.  Grossly disorganized (confused or unfocused) behavior or extremely overactive or underactive motor activity (catatonia).  Negative symptoms such as bland or blunted emotions (flat affect), loss of will power (avolition), and withdrawal from social contacts (social isolation).  Level of functioning in one or more major areas of life (work, school, relationships, or self-care) is markedly below the level of functioning before the onset of illness.   There are continuous signs of illness (either mild symptoms or decreased level of functioning) for at least 6 months or longer. TREATMENT  Schizophrenia is a long-term illness. It is best controlled with continuous treatment rather than treatment only when symptoms occur. The following treatments are used to manage schizophrenia:  Medication--Medication is the most effective and important form of treatment for schizophrenia. Antipsychotic medications are usually prescribed to help manage schizophrenia. Other types of medication may be added to relieve any symptoms that may occur despite the use of antipsychotic medications.  Counseling or talk therapy--Individual, group, or family counseling may be helpful in providing education, support, and guidance. Many people with schizophrenia also benefit from social skills and job skills (vocational) training. A combination of medication and counseling is best for managing the disorder over time. A procedure in which electricity is applied to the brain through the scalp (electroconvulsive therapy) may be used to  treat catatonic schizophrenia or schizophrenia in people who cannot take or  do not respond to medication and counseling. Document Released: 12/05/2000 Document Revised: 08/10/2013 Document Reviewed: 03/02/2013 [facility] Patient Information 2015 [place], [place]. This information is not intended to replace advice given to you by your health care provider. Make sure you discuss any questions you have with your health care provider.

## 2014-11-23 NOTE — BH Assessment (Signed)
Tele Assessment Note   Kyle Valenzuela is an 53 y.o. male who was taken to [facility] by his caregiver due to "verbally aggressive" behavior. [facility] recommended that he come to the ED to be evaluated. On assessment of pt he denies SI, HI, A/V hallucinations and was calm and cooperative with staff. He says he likes the group home and wants to go back and states that he apologized to his caregiver for his behavior earlier. Pt kept stating that he was fine and that he was "just upset" earlier. Pt states that he has family supports and his mother and sister are active in his life. He stated that he has no history of SI or HI but did state he goes to [facility] for outpatient services. Recommendation is that he go back to the group home and continue services on an outpatient basis per Probation officer and Dr. Nicole Cella.   Axis I: Adjustment Disorder with Mixed Disturbance of Emotions and Conduct  Past Medical History:  Past Medical History  Diagnosis Date  . Chronic kidney disease   . COPD (chronic obstructive pulmonary disease)   . Hyperglycemia   . Schizophrenia   . Hypertension     Past Surgical History  Procedure Laterality Date  . Tonsillectomy      as a child  . Multiple tooth extractions    . Av fistula placement  04/22/2012    Procedure: ARTERIOVENOUS (AV) FISTULA CREATION;  Surgeon: Serafina Mitchell, MD;  Location: [place];  Service: Vascular;  Laterality: Right;  . Av fistula placement  07/30/12    Right Ligation of competing branches  AVF    Family History: History reviewed. No pertinent family history.  Social History:  reports that he has quit smoking. His smoking use included Cigarettes. He smoked 0.50 packs per day. He has never used smokeless tobacco. He reports that he does not drink alcohol or use illicit drugs.  Additional Social History:     CIWA: CIWA-Ar BP: 124/79 mmHg Pulse Rate: 60 COWS:    PATIENT STRENGTHS: (choose at least two) General fund of knowledge Supportive  family/friends  Allergies: No Known Allergies  Home Medications:  (Not in a hospital admission)  OB/GYN Status:  No LMP for male patient.  General Assessment Data Location of Assessment: [facility] ACT Assessment: Yes Is this a Tele or Face-to-Face Assessment?: Tele Assessment Is this an Initial Assessment or a Re-assessment for this encounter?: Initial Assessment Living Arrangements: Other (Comment) (Group Home) Can pt return to current living arrangement?: Yes Transfer from: Group Home Referral Source: Self/Family/Friend     [place] Living Arrangements: Other (Comment) (Group Home) Name of Psychiatrist:  (Dr. at [facility] ) Name of Therapist:  Chinita Pester)  Education Status Is patient currently in school?: No Current Grade:  (N/A) Highest grade of school patient has completed:  (Unknown) Name of school:  (N/A) Contact person:  (N/A)  Risk to self with the past 6 months Suicidal Ideation: No Suicidal Intent: No Is patient at risk for suicide?: No Suicidal Plan?: No Access to Means: No What has been your use of drugs/alcohol within the last 12 months?:  (None) Previous Attempts/Gestures: No Other Self Harm Risks:  (None) Triggers for Past Attempts:  (N/A) Intentional Self Injurious Behavior: None (No) Family Suicide History: Unknown Recent stressful life event(s): Other (Comment) (None reported) Persecutory voices/beliefs?: No Depression: No Substance abuse history and/or treatment for substance abuse?: No Suicide prevention information given to non-admitted patients: Not applicable  Risk to Others  within the past 6 months Homicidal Ideation: No Thoughts of Harm to Others: No Current Homicidal Intent: No Current Homicidal Plan: No Access to Homicidal Means: No History of harm to others?: No Assessment of Violence: None Noted Violent Behavior Description:  (Caregiver reports that he has been verballty aggressive) Does patient have access to weapons?:  No Criminal Charges Pending?: No Does patient have a court date: No  Psychosis Hallucinations: None noted Delusions: None noted  Mental Status Report Appear/Hygiene: Unremarkable Eye Contact: Good Motor Activity: Unremarkable Speech: Rapid Level of Consciousness: Alert Anxiety Level: Minimal Thought Processes: Coherent Judgement: Partial Orientation: Place Obsessive Compulsive Thoughts/Behaviors: Minimal  Cognitive Functioning Concentration: Fair Memory: Unable to Assess IQ: Below Average Level of Function:  (unknown) Insight: Poor Impulse Control: Fair Appetite:  (UTA) Weight Loss:  (UTA) Weight Gain:  (UTA) Sleep: No Change Total Hours of Sleep:  (Unknown) Vegetative Symptoms: None  ADLScreening [facility] Assessment Services) Patient's cognitive ability adequate to safely complete daily activities?: Yes Patient able to express need for assistance with ADLs?: Yes Independently performs ADLs?:  (May need some assistance)  Prior Inpatient Therapy Prior Inpatient Therapy:  (UTA) Prior Therapy Dates:  (UTA) Prior Therapy Facilty/Provider(s):  ([facility]) Reason for Treatment:  (UTA)  Prior Outpatient Therapy Prior Outpatient Therapy: Yes Prior Therapy Dates:  (11/23/14) Prior Therapy Facilty/Provider(s):  ([facility]) Reason for Treatment:  ("verbally aggressive behavior per caregiver")  ADL Screening (condition at time of admission) Patient's cognitive ability adequate to safely complete daily activities?: Yes Patient able to express need for assistance with ADLs?: Yes Independently performs ADLs?:  (May need some assistance)                  Additional Information 1:1 In Past 12 Months?: No CIRT Risk: No Elopement Risk: No Does patient have medical clearance?:  (Unknown)     Disposition:  Disposition Initial Assessment Completed for this Encounter: Yes Disposition of Patient: Other dispositions Other disposition(s):  (Back to group home ) Per Dr.  Nicole Cella @6 :30p  Kyle Valenzuela 11/23/2014 6:35 PM

## 2014-11-23 NOTE — ED Notes (Signed)
Caregiver from [place] with pt.

## 2014-11-23 NOTE — ED Notes (Addendum)
Went to [facility] and was told to bring pt to ER.  Caretaker says  Pt drank more than 2 gallons of water yesterday.  Says pt is not sleeping and continues to request water.  Caregiver says pt is very verbally aggressive towards caregivers.

## 2014-11-23 NOTE — ED Provider Notes (Signed)
CSN: [id number]     Arrival date & time 11/23/14  1459 History  This chart was scribed for Kyle Furry, MD by Rayfield Citizen, ED Scribe. This patient was seen in room APA17/[facility]17 and the patient's care was started at 3:25 PM.    Chief Complaint  Patient presents with  . V70.1   The history is provided by the patient and a caregiver. No language interpreter was used.     HPI Comments: Kyle Valenzuela is a 53 y.o. male who presents to the Emergency Department complaining of aggressive behavior. Caretaker reports that patient has been extremely verbally aggressive for the past 2-3 weeks and 1 month of difficulty sleeping. He is a resident at [facility] home and [facility] reccommended that he come to the ED for evaluation.   She also reports that he has been drinking water excessively ("2 gallons of water in one day").    Past Medical History  Diagnosis Date  . Chronic kidney disease   . COPD (chronic obstructive pulmonary disease)   . Hyperglycemia   . Schizophrenia   . Hypertension    Past Surgical History  Procedure Laterality Date  . Tonsillectomy      as a child  . Multiple tooth extractions    . Av fistula placement  04/22/2012    Procedure: ARTERIOVENOUS (AV) FISTULA CREATION;  Surgeon: Serafina Mitchell, MD;  Location: [place];  Service: Vascular;  Laterality: Right;  . Av fistula placement  07/30/12    Right Ligation of competing branches  AVF   History reviewed. No pertinent family history. History  Substance Use Topics  . Smoking status: Former Smoker -- 0.50 packs/day    Types: Cigarettes  . Smokeless tobacco: Never Used     Comment: 7 cigs per day  . Alcohol Use: No    Review of Systems  Constitutional: Negative for fever, chills, diaphoresis, appetite change and fatigue.  HENT: Negative for mouth sores, sore throat and trouble swallowing.   Eyes: Negative for visual disturbance.  Respiratory: Negative for cough, chest tightness, shortness of breath and wheezing.    Cardiovascular: Negative for chest pain.  Gastrointestinal: Negative for nausea, vomiting, abdominal pain, diarrhea and abdominal distention.  Endocrine: Positive for polydipsia. Negative for polyphagia and polyuria.  Genitourinary: Negative for dysuria, frequency and hematuria.  Musculoskeletal: Negative for gait problem.  Skin: Negative for color change, pallor and rash.  Neurological: Negative for dizziness, syncope, light-headedness and headaches.  Hematological: Does not bruise/bleed easily.  Psychiatric/Behavioral: Negative for behavioral problems and confusion.       Aggressive behavior      Allergies  Review of patient's allergies indicates no known allergies.  Home Medications   Prior to Admission medications   Medication Sig Start Date End Date Taking? Authorizing Provider  acetaminophen-codeine (TYLENOL #3) 300-30 MG per tablet Take 1 tablet by mouth every 4 (four) hours as needed for moderate pain.   Yes Historical Provider, MD  albuterol (PROVENTIL HFA;VENTOLIN HFA) 108 (90 BASE) MCG/ACT inhaler Inhale 2 puffs into the lungs every 4 (four) hours as needed. For shortness of breath   Yes Historical Provider, MD  ALPRAZolam Duanne Moron) 1 MG tablet Take 1 mg by mouth 4 (four) times daily.    Yes Historical Provider, MD  amLODipine (NORVASC) 10 MG tablet Take 10 mg by mouth daily.   Yes Historical Provider, MD  carvedilol (COREG) 6.25 MG tablet Take 6.25 mg by mouth 2 (two) times daily with a meal.   Yes Historical  Provider, MD  divalproex (DEPAKOTE) 500 MG DR tablet Take 500 mg by mouth 2 (two) times daily.   Yes Historical Provider, MD  OLANZapine (ZYPREXA) 10 MG tablet Take 10 mg by mouth daily.   Yes Historical Provider, MD  polyethylene glycol powder (GLYCOLAX/MIRALAX) powder Take 17 g by mouth daily.   Yes Historical Provider, MD  sodium bicarbonate 650 MG tablet Take 650 mg by mouth 2 (two) times daily.   Yes Historical Provider, MD  traZODone (DESYREL) 150 MG tablet Take  150 mg by mouth at bedtime.   Yes Historical Provider, MD  LORazepam (ATIVAN) 2 MG tablet Give 1 tablet at bedtime. May give 1 tablet each day at noon for hyperactivity 11/23/14   Kyle Furry, MD   BP 124/79 mmHg  Pulse 60  Temp(Src) 98 F (36.7 C) (Oral)  Resp 18  Ht 5\' 8"  (1.727 m)  Wt 180 lb (81.647 kg)  BMI 27.38 kg/m2  SpO2 100% Physical Exam  Constitutional: He is oriented to person, place, and time. He appears well-developed and well-nourished. No distress.  HENT:  Head: Normocephalic.  Eyes: Conjunctivae are normal. Pupils are equal, round, and reactive to light. No scleral icterus.  Neck: Normal range of motion. Neck supple. No thyromegaly present.  Cardiovascular: Normal rate and regular rhythm.  Exam reveals no gallop and no friction rub.   No murmur heard. Pulmonary/Chest: Effort normal and breath sounds normal. No respiratory distress. He has no wheezes. He has no rales.  Abdominal: Soft. Bowel sounds are normal. He exhibits no distension. There is no tenderness. There is no rebound.  Musculoskeletal: Normal range of motion.  Neurological: He is alert and oriented to person, place, and time.  Intact; simple/limited intellect   Skin: Skin is warm and dry. No rash noted.  Psychiatric: He has a normal mood and affect. His behavior is normal.    ED Course  Procedures   DIAGNOSTIC STUDIES: Oxygen Saturation is 99% on RA, normal by my interpretation.    COORDINATION OF CARE: 3:30PM Discussed treatment plan with pt at bedside and pt agreed to plan.   Labs Review Labs Reviewed  CBC WITH DIFFERENTIAL - Abnormal; Notable for the following:    RBC 4.07 (*)    Hemoglobin 12.7 (*)    HCT 37.3 (*)    Platelets 141 (*)    All other components within normal limits  BASIC METABOLIC PANEL - Abnormal; Notable for the following:    Glucose, Bld 100 (*)    BUN 36 (*)    Creatinine, Ser 4.13 (*)    GFR calc non Af Amer 15 (*)    GFR calc Af Amer 17 (*)    All other  components within normal limits  URINALYSIS, ROUTINE W REFLEX MICROSCOPIC - Abnormal; Notable for the following:    Specific Gravity, Urine <1.005 (*)    Hgb urine dipstick TRACE (*)    All other components within normal limits  CBG MONITORING, ED - Abnormal; Notable for the following:    Glucose-Capillary 150 (*)    All other components within normal limits  VALPROIC ACID LEVEL  URINE MICROSCOPIC-ADD ON  TSH  OSMOLALITY  CBG MONITORING, ED    Imaging Review No results found.   EKG Interpretation None      MDM   Final diagnoses:  Psychogenic polydipsia  Other schizophrenia   Patient underwent evaluation at [facility]. I did request that the evaluator speak specifically with the psychiatrist about medication changes. Per Dr.Aktar.  Kyle Furry, MD 11/23/14 [phone]

## 2014-11-24 LAB — TSH: TSH: 1.01 u[IU]/mL (ref 0.350–4.500)

## 2014-11-24 LAB — OSMOLALITY: Osmolality: 303 mOsm/kg — ABNORMAL HIGH (ref 275–300)

## 2015-05-03 ENCOUNTER — Emergency Department (HOSPITAL_COMMUNITY)
Admission: EM | Admit: 2015-05-03 | Discharge: 2015-05-12 | Disposition: A | Discharge status: Home / Self Care | Payer: Medicare Other | Attending: Emergency Medicine | Admitting: Emergency Medicine

## 2015-05-03 ENCOUNTER — Encounter (HOSPITAL_COMMUNITY): Payer: Self-pay | Admitting: *Deleted

## 2015-05-03 DIAGNOSIS — F1310 Sedative, hypnotic or anxiolytic abuse, uncomplicated: Secondary | ICD-10-CM | POA: Insufficient documentation

## 2015-05-03 DIAGNOSIS — R631 Polydipsia: Secondary | ICD-10-CM | POA: Diagnosis not present

## 2015-05-03 DIAGNOSIS — Z992 Dependence on renal dialysis: Secondary | ICD-10-CM | POA: Diagnosis not present

## 2015-05-03 DIAGNOSIS — N186 End stage renal disease: Secondary | ICD-10-CM | POA: Diagnosis not present

## 2015-05-03 DIAGNOSIS — Z79899 Other long term (current) drug therapy: Secondary | ICD-10-CM | POA: Diagnosis not present

## 2015-05-03 DIAGNOSIS — J449 Chronic obstructive pulmonary disease, unspecified: Secondary | ICD-10-CM | POA: Insufficient documentation

## 2015-05-03 DIAGNOSIS — F209 Schizophrenia, unspecified: Secondary | ICD-10-CM | POA: Insufficient documentation

## 2015-05-03 DIAGNOSIS — Z87448 Personal history of other diseases of urinary system: Secondary | ICD-10-CM

## 2015-05-03 DIAGNOSIS — F54 Psychological and behavioral factors associated with disorders or diseases classified elsewhere: Secondary | ICD-10-CM | POA: Diagnosis not present

## 2015-05-03 DIAGNOSIS — Z87891 Personal history of nicotine dependence: Secondary | ICD-10-CM | POA: Insufficient documentation

## 2015-05-03 DIAGNOSIS — F458 Other somatoform disorders: Secondary | ICD-10-CM | POA: Insufficient documentation

## 2015-05-03 DIAGNOSIS — F05 Delirium due to known physiological condition: Secondary | ICD-10-CM | POA: Diagnosis not present

## 2015-05-03 DIAGNOSIS — I120 Hypertensive chronic kidney disease with stage 5 chronic kidney disease or end stage renal disease: Secondary | ICD-10-CM | POA: Diagnosis not present

## 2015-05-03 DIAGNOSIS — F911 Conduct disorder, childhood-onset type: Secondary | ICD-10-CM | POA: Diagnosis not present

## 2015-05-03 DIAGNOSIS — Z008 Encounter for other general examination: Secondary | ICD-10-CM | POA: Diagnosis present

## 2015-05-03 HISTORY — DX: Polydipsia: R63.1

## 2015-05-03 LAB — URINALYSIS, ROUTINE W REFLEX MICROSCOPIC
Bilirubin Urine: NEGATIVE
Glucose, UA: NEGATIVE mg/dL
KETONES UR: NEGATIVE mg/dL
LEUKOCYTES UA: NEGATIVE
NITRITE: NEGATIVE
PROTEIN: NEGATIVE mg/dL
Specific Gravity, Urine: 1.01 (ref 1.005–1.030)
UROBILINOGEN UA: 0.2 mg/dL (ref 0.0–1.0)
pH: 6 (ref 5.0–8.0)

## 2015-05-03 LAB — CBC WITH DIFFERENTIAL/PLATELET
Basophils Absolute: 0 10*3/uL (ref 0.0–0.1)
Basophils Relative: 0 % (ref 0–1)
Eosinophils Absolute: 0.2 10*3/uL (ref 0.0–0.7)
Eosinophils Relative: 4 % (ref 0–5)
HEMATOCRIT: 38 % — AB (ref 39.0–52.0)
HEMOGLOBIN: 12.7 g/dL — AB (ref 13.0–17.0)
LYMPHS ABS: 1.3 10*3/uL (ref 0.7–4.0)
LYMPHS PCT: 30 % (ref 12–46)
MCH: 31 pg (ref 26.0–34.0)
MCHC: 33.4 g/dL (ref 30.0–36.0)
MCV: 92.7 fL (ref 78.0–100.0)
MONO ABS: 0.3 10*3/uL (ref 0.1–1.0)
MONOS PCT: 6 % (ref 3–12)
NEUTROS ABS: 2.7 10*3/uL (ref 1.7–7.7)
Neutrophils Relative %: 60 % (ref 43–77)
Platelets: 147 10*3/uL — ABNORMAL LOW (ref 150–400)
RBC: 4.1 MIL/uL — AB (ref 4.22–5.81)
RDW: 12.8 % (ref 11.5–15.5)
WBC: 4.5 10*3/uL (ref 4.0–10.5)

## 2015-05-03 LAB — COMPREHENSIVE METABOLIC PANEL
ALBUMIN: 4.5 g/dL (ref 3.5–5.0)
ALT: 19 U/L (ref 17–63)
ANION GAP: 9 (ref 5–15)
AST: 21 U/L (ref 15–41)
Alkaline Phosphatase: 77 U/L (ref 38–126)
BILIRUBIN TOTAL: 0.6 mg/dL (ref 0.3–1.2)
BUN: 39 mg/dL — ABNORMAL HIGH (ref 6–20)
CO2: 28 mmol/L (ref 22–32)
CREATININE: 4.4 mg/dL — AB (ref 0.61–1.24)
Calcium: 9.2 mg/dL (ref 8.9–10.3)
Chloride: 105 mmol/L (ref 101–111)
GFR calc Af Amer: 16 mL/min — ABNORMAL LOW (ref >60)
GFR calc non Af Amer: 14 mL/min — ABNORMAL LOW (ref >60)
Glucose, Bld: 94 mg/dL (ref 65–99)
Potassium: 4.4 mmol/L (ref 3.5–5.1)
Sodium: 142 mmol/L (ref 135–145)
Total Protein: 7.3 g/dL (ref 6.5–8.1)

## 2015-05-03 LAB — RAPID URINE DRUG SCREEN, HOSP PERFORMED
Amphetamines: NOT DETECTED
Barbiturates: NOT DETECTED
Benzodiazepines: POSITIVE — AB
Cocaine: NOT DETECTED
Opiates: NOT DETECTED
TETRAHYDROCANNABINOL: NOT DETECTED

## 2015-05-03 LAB — ETHANOL: Alcohol, Ethyl (B): <5 mg/dL (ref <5)

## 2015-05-03 LAB — URINE MICROSCOPIC-ADD ON

## 2015-05-03 NOTE — ED Notes (Signed)
Patient given sprite per request.

## 2015-05-03 NOTE — ED Notes (Signed)
Pt is from [employer]. Staff states patient has seemed manic x 1 mo. States he was up at 0200 this morning. States pt is becoming defiant, not following rules and regulations, argumentative and rebellious. States pt has been obsessed with drinking water. States drinking from the toilet, faucets, and dog bowl. [place] staff state that they have had to turn faucets off in the house. Pt denies SI/HI. Staff states it is causing lots of problems at the home with the other residents because they are having to keep water turned off.

## 2015-05-03 NOTE — ED Notes (Signed)
TTS consult at this time.

## 2015-05-03 NOTE — ED Provider Notes (Signed)
CSN: [id number]     Arrival date & time 05/03/15  1320 History   First MD Initiated Contact with Patient 05/03/15 1536     Chief Complaint  Patient presents with  . V70.1     (Consider location/radiation/quality/duration/timing/severity/associated sxs/prior Treatment) HPI Comments: Patient is a 54 year old male with past medical history of schizophrenia, psychogenic polydipsia, chronic renal insufficiency. He presents for evaluation of increased thirst, excessive water drinking, and aggressive behavior towards other residents of the group home in which she resides. The caretaker who is present at bedside states that he has been drinking out of toilets, and any other water he can get a hold of. Patient denies to me he is experiencing any suicidal or homicidal ideation. He denies any auditory or visual hallucinations.  The history is provided by the patient.    Past Medical History  Diagnosis Date  . Chronic kidney disease   . COPD (chronic obstructive pulmonary disease)   . Hyperglycemia   . Schizophrenia   . Hypertension   . Polydipsia    Past Surgical History  Procedure Laterality Date  . Tonsillectomy      as a child  . Multiple tooth extractions    . Av fistula placement  04/22/2012    Procedure: ARTERIOVENOUS (AV) FISTULA CREATION;  Surgeon: Serafina Mitchell, MD;  Location: [facility];  Service: Vascular;  Laterality: Right;  . Av fistula placement  07/30/12    Right Ligation of competing branches  AVF   No family history on file. History  Substance Use Topics  . Smoking status: Former Smoker -- 0.50 packs/day    Types: Cigarettes  . Smokeless tobacco: Never Used     Comment: 7 cigs per day  . Alcohol Use: No    Review of Systems  All other systems reviewed and are negative.     Allergies  Review of patient's allergies indicates no known allergies.  Home Medications   Prior to Admission medications   Medication Sig Start Date End Date Taking? Authorizing Provider   acetaminophen-codeine (TYLENOL #3) 300-30 MG per tablet Take 1 tablet by mouth every 4 (four) hours as needed for moderate pain.   Yes Historical Provider, MD  albuterol (PROVENTIL HFA;VENTOLIN HFA) 108 (90 BASE) MCG/ACT inhaler Inhale 2 puffs into the lungs every 4 (four) hours as needed. For shortness of breath   Yes Historical Provider, MD  ALPRAZolam Duanne Moron) 1 MG tablet Take 1 mg by mouth 4 (four) times daily.    Yes Historical Provider, MD  amLODipine (NORVASC) 10 MG tablet Take 10 mg by mouth daily.   Yes Historical Provider, MD  carvedilol (COREG) 6.25 MG tablet Take 6.25 mg by mouth 2 (two) times daily with a meal.   Yes Historical Provider, MD  divalproex (DEPAKOTE) 500 MG DR tablet Take 500 mg by mouth 2 (two) times daily.   Yes Historical Provider, MD  OLANZapine (ZYPREXA) 10 MG tablet Take 10 mg by mouth daily.   Yes Historical Provider, MD  polyethylene glycol powder (GLYCOLAX/MIRALAX) powder Take 17 g by mouth daily.   Yes Historical Provider, MD  sodium bicarbonate 650 MG tablet Take 650 mg by mouth 2 (two) times daily.   Yes Historical Provider, MD  traZODone (DESYREL) 150 MG tablet Take 150 mg by mouth at bedtime.   Yes Historical Provider, MD  LORazepam (ATIVAN) 2 MG tablet Give 1 tablet at bedtime. May give 1 tablet each day at noon for hyperactivity Patient not taking: Reported on 05/03/2015 11/23/14  Tanna Furry, MD   BP 117/73 mmHg  Pulse 78  Temp(Src) 98.2 F (36.8 C) (Oral)  Resp 16  Ht 5\' 8"  (1.727 m)  Wt 180 lb (81.647 kg)  BMI 27.38 kg/m2  SpO2 100% Physical Exam  Constitutional: He is oriented to person, place, and time.  Awake, alert, nontoxic appearance.  HENT:  Head: Atraumatic.  Eyes: Right eye exhibits no discharge. Left eye exhibits no discharge.  Neck: Neck supple.  Pulmonary/Chest: Effort normal. He exhibits no tenderness.  Abdominal: Soft. There is no tenderness. There is no rebound.  Musculoskeletal: He exhibits no tenderness.  Baseline ROM, no  obvious new focal weakness.  Neurological: He is alert and oriented to person, place, and time.  Mental status and motor strength appears baseline for patient and situation.  Skin: No rash noted.  Psychiatric: He has a normal mood and affect. His speech is normal. Judgment and thought content normal. He is aggressive and hyperactive. Cognition and memory are normal.  Nursing note and vitals reviewed.   ED Course  Procedures (including critical care time) Labs Review Labs Reviewed  CBC WITH DIFFERENTIAL/PLATELET  COMPREHENSIVE METABOLIC PANEL  ETHANOL  URINALYSIS, ROUTINE W REFLEX MICROSCOPIC  URINE RAPID DRUG SCREEN (HOSP PERFORMED)    Imaging Review No results found.   EKG Interpretation None      MDM   Final diagnoses:  None    Patient evaluated by TTS, meets inpatient criteria.  Awaiting placement.    Veryl Speak, MD 05/06/15 0900

## 2015-05-03 NOTE — BH Assessment (Signed)
TTS [facility]) will assess the patient.

## 2015-05-03 NOTE — ED Notes (Signed)
Patient resting in bed with eyes closed; lying on right side.  Respirations even and unlabored.

## 2015-05-03 NOTE — Progress Notes (Addendum)
Patient has been referred for IP treatment at: [facility], [facility], and [facility].  [facility] - per [place], no bed tonight, ok to fax. MD would decide if geri unit can meet pt's medical and psychiatric needs. Per Erick Blinks, at capacity but will keep referral till am. [facility] - per Jeani Hawking, will call writer back and let know if pt appropriate. Per Jeani Hawking, 1 geri bed open. Per Jeani Hawking, fax referral. Per Jeani Hawking, referral received. [facility] - per Daleen Snook, fax referral. Per Daleen Snook, refax it. Per Daleen Snook, referral received and will look at it in am. [facility] - per Ander Purpura, fax referral. Beds open, we'll look at pt's med/psych needs. Per Ander Purpura, referral received.  Declined at: [facility] - Per Ronalee Belts, fax referral if pt does not need dialysis.  Per RN Olivia Mackie, pt's nurse, pt doesn't need dialysis. Per Dallas Breeding, RN is reviewing it. Per RN Arbie Cookey, can't take patient due to "drinking water and kidney disease. [facility] - per Caryl Pina, patient needs higher level of care.   Per Caryl Pina, will have beds open for child/adolescent, adult and geri tomorrow.  At capacity: [facility] - per Iona Beard, adult unit is full but geri beds open, fax referral. Per Iona Beard, unit is at capacity   CSW will continue to seek placement.  Verlon Setting, [place] Disposition staff 05/03/2015 8:32 PM

## 2015-05-03 NOTE — ED Notes (Signed)
[facility] [place], [address], [place] [address]  [phone] Jeronimo Greaves (co-owner)  [phone]

## 2015-05-03 NOTE — BH Assessment (Addendum)
Tele Assessment Note   Kyle Valenzuela is an 54 y.o. male was brought to [facility] today after getting upset about not being able to drink any more water. Pt reports that he is a resident at [facility] which is run by Kyle Valenzuela. Pt has been at [facility] since 2013. Pt denies SI, HI, depression, Anxiety, A/V hallucinations, and substance abuse. Pt reports that he is good and nothing is wrong. Pt was apologetic and remorseful during assessment. Per chat review, pt has been to [facility] in 2007 and in 2010.   Per Kyle Valenzuela, group home owner, pt has been drinking water non-stop, even out of the toilets and dog bowl. Pt has been upset about not getting to drink any water and has been mildly aggressive and defiant towards the staff. Pt will respond to internal stimuli at times and has a history of schizophrenia. Pt sees Dr. Hoyle Valenzuela at [facility] for medication management. 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 Per Kyle Valenzuela, pt hardly sleeps at all and is very anxious, pacing the halls. Per Kyle Valenzuela, pt needs help sleeping and a possible medication adjustment.        Per Dr. Parke Poisson, pt meets criteria for inpatient treatment. Per Debarah Crape, RN, pt is declined at [facility] due to medical acuity. Placement will be sought for Kyle Valenzuela.   Axis I: F99 Unspecified mental disorder Axis II: Deferred Axis III:  Past Medical History  Diagnosis Date  . Chronic kidney disease   . COPD (chronic obstructive pulmonary disease)   . Hyperglycemia   . Schizophrenia   . Hypertension   . Polydipsia    Axis IV: problems with access to health care services Axis V: 51-60 moderate symptoms  Past Medical History:  Past Medical History  Diagnosis Date  . Chronic kidney disease   . COPD (chronic obstructive pulmonary disease)   . Hyperglycemia   . Schizophrenia   . Hypertension   . Polydipsia     Past Surgical History  Procedure Laterality Date  . Tonsillectomy       as a child  . Multiple tooth extractions    . Av fistula placement  04/22/2012    Procedure: ARTERIOVENOUS (AV) FISTULA CREATION;  Surgeon: Serafina Mitchell, MD;  Location: [facility];  Service: Vascular;  Laterality: Right;  . Av fistula placement  07/30/12    Right Ligation of competing branches  AVF    Family History: No family history on file.  Social History:  reports that he has quit smoking. His smoking use included Cigarettes. He smoked 0.50 packs per day. He has never used smokeless tobacco. He reports that he does not drink alcohol or use illicit drugs.  Additional Social History:  Alcohol / Drug Use Pain Medications: pt denies Prescriptions: pt denies Over the Counter: pt denies History of alcohol / drug use?: No history of alcohol / drug abuse  CIWA: CIWA-Ar BP: 117/73 mmHg Pulse Rate: 78 COWS:    PATIENT STRENGTHS: (choose at least two) Physical Health Supportive family/friends  Allergies: No Known Allergies  Home Medications:  (Not in a hospital admission)  OB/GYN Status:  No LMP for male patient.  General Assessment Data Location of Assessment: [facility] TTS Assessment: In system Is this a Tele or Face-to-Face Assessment?: Tele Assessment Is this an Initial Assessment or a Re-assessment for this encounter?: Initial Assessment Marital status: Single Is patient pregnant?: No Pregnancy Status: No Living  Arrangements: Group Home ([facility], for [facility] years) Can pt return to current living arrangement?: Yes Admission Status: Voluntary Is patient capable of signing voluntary admission?: Yes Referral Source: Self/Family/Friend  Medical Screening Exam [facility] Walk-in ONLY) Medical Exam completed: Yes  Crisis Care Plan Living Arrangements: Group Home ([facility], for [facility])  Education Status Is patient currently in school?: No Highest grade of school patient has completed: 12  Risk to self with the past 6 months Suicidal Ideation:  No Has patient been a risk to self within the past 6 months prior to admission? : No Suicidal Intent: No Has patient had any suicidal intent within the past 6 months prior to admission? : No Is patient at risk for suicide?: No Suicidal Plan?: No Has patient had any suicidal plan within the past 6 months prior to admission? : No Access to Means: No Previous Attempts/Gestures: No Triggers for Past Attempts: None known Family Suicide History: No Recent stressful life event(s): Other (Comment) (not being able to get water when he wants it) Persecutory voices/beliefs?: No Depression: No Substance abuse history and/or treatment for substance abuse?: No Suicide prevention information given to non-admitted patients: Not applicable  Risk to Others within the past 6 months Homicidal Ideation: No Does patient have any lifetime risk of violence toward others beyond the six months prior to admission? : No Thoughts of Harm to Others: No Current Homicidal Intent: No Current Homicidal Plan: No Access to Homicidal Means: No History of harm to others?: No Assessment of Violence: None Noted Does patient have access to weapons?: No Criminal Charges Pending?: No Does patient have a court date: No Is patient on probation?: No  Psychosis Hallucinations: None noted Delusions: None noted  Mental Status Report Appearance/Hygiene: In hospital gown, Unremarkable Eye Contact: Fair Motor Activity: Freedom of movement, Restlessness, Unremarkable Speech: Logical/coherent, Unremarkable Level of Consciousness: Alert Mood: Anxious Anxiety Level: None Thought Processes: Relevant, Coherent, Tangential Judgement: Impaired Orientation: Person, Place, Time, Situation  Cognitive Functioning Concentration: Decreased Memory: Recent Impaired, Remote Intact Impulse Control: Poor Appetite: Good Weight Loss: 10 Sleep: Decreased Total Hours of Sleep: 5 (pacing, changes his mind, wondering,) Vegetative  Symptoms: None  ADLScreening [facility] Assessment Services) Patient's cognitive ability adequate to safely complete daily activities?: No Patient able to express need for assistance with ADLs?: Yes Independently performs ADLs?: No  Prior Inpatient Therapy Prior Inpatient Therapy: Yes Prior Therapy Dates: 2007, 2010 Prior Therapy Facilty/Provider(s): [facility] Reason for Treatment: schizophrenia  Prior Outpatient Therapy Prior Outpatient Therapy: Yes Prior Therapy Dates: ongoing Prior Therapy Facilty/Provider(s): dr. lay Reason for Treatment: schizophrenia Does patient have an ACCT team?: No Does patient have Intensive In-House Services?  : No Does patient have [facility] services? : No Does patient have P4CC services?: No  ADL Screening (condition at time of admission) Patient's cognitive ability adequate to safely complete daily activities?: No Is the patient deaf or have difficulty hearing?: No Does the patient have difficulty seeing, even when wearing glasses/contacts?: No Does the patient have difficulty concentrating, remembering, or making decisions?: No Patient able to express need for assistance with ADLs?: Yes Does the patient have difficulty dressing or bathing?: Yes Independently performs ADLs?: No Communication: Independent Dressing (OT): Needs assistance Grooming: Independent Feeding: Independent Bathing: Needs assistance Toileting: Needs assistance In/Out Bed: Independent Does the patient have difficulty walking or climbing stairs?: No       Abuse/Neglect Assessment (Assessment to be complete while patient is alone) Physical Abuse: Denies Verbal Abuse: Denies  Sexual Abuse: Denies Exploitation of patient/patient's resources: Denies Self-Neglect: Denies Values / Beliefs Cultural Requests During Hospitalization: None Spiritual Requests During Hospitalization: None Consults Spiritual Care Consult Needed: No Social Work Consult Needed: No Regulatory affairs officer (For  Healthcare) Does patient have an advance directive?: No Would patient like information on creating an advanced directive?: No - patient declined information (sister is medical power of attorney)    Additional Information 1:1 In Past 12 Months?: No CIRT Risk: No Elopement Risk: No Does patient have medical clearance?: Yes     Disposition:  Disposition Initial Assessment Completed for this Encounter: Yes Disposition of Patient: Other dispositions  Ruben Reason, MA, Alesia Banda, [place] Therapeutic Triage Specialist [facility]    05/03/2015 5:40 PM

## 2015-05-03 NOTE — ED Notes (Signed)
Patient requesting another Sprite; informed patient that he just had one and couldn't have another at this time.

## 2015-05-03 NOTE — ED Notes (Signed)
Notified by Arbie Cookey at [facility] that they are declining patient due to medical.

## 2015-05-04 NOTE — ED Notes (Signed)
Patient resting in bed with eyes closed.  Equal rise and fall of chest noted.

## 2015-05-04 NOTE — ED Notes (Signed)
Patient given a cup of water

## 2015-05-04 NOTE — ED Notes (Signed)
Pt noted walking to bathroom several times within the hour. Pt states he was peeing and washing his hands. Pt was made aware that he would have to limit bathroom trips and make the nurse aware when he needs to go.

## 2015-05-04 NOTE — ED Notes (Signed)
Pt Mother-Nancy Serafin [phone].

## 2015-05-04 NOTE — ED Notes (Signed)
Patient resting in bed with eyes closed.  Patient arouses to verbal stimuli.  No outbursts or aggressive behavior noted this shift.

## 2015-05-04 NOTE — Progress Notes (Signed)
CSW called [facility] intake. (Pt has been accepted pending bed availability per 05/04/15 06:45 note.) Left 2 voicemails inquiring as to status of bed availability and requesting to be informed if any additional documentation is needed prior to [facility] assigning bed and extending acceptance.   Sharren Bridge, MSW, [place] Clinical Social Work, Disposition  05/04/2015 [phone]

## 2015-05-04 NOTE — ED Notes (Signed)
Patient resting with eyes closed

## 2015-05-04 NOTE — BH Assessment (Signed)
Per Brooke Pace at [facility] pt has been accepted by Dr. Tommye Standard once a bed becomes available. Per Lauren they will contact TTS once bed is available.   Informed Norm Parcel of acceptance.   Lear Ng, [facility] Triage Specialist 05/04/2015 6:47 AM

## 2015-05-04 NOTE — ED Notes (Signed)
Sister, [name], is patients POA.   She requests to be made aware when patient has placement.   Contact number is: [phone]

## 2015-05-04 NOTE — Progress Notes (Signed)
Spoke with Nunzio Cory, intake staff at [facility]. She states NP is re-reviewing pt's referral, they have concerns about his renal failure. She states there will be no beds available today but that she will follow up with acceptance or declination.  CSW faxed referral to: [facility]- per Cleatrice Burke- per Laverda Sorenson- per Aram Beecham  At capacity: [facility]- per [facility]- per Jonathon Jordan- per Bearl Mulberry at: [facility]- per Jeanell Sparrow [facility]ell unit age requirement is 65+ and pt not appropriate for other units) [facility]- d/t renal failure per [facility]- d/t renal failure per Danice Goltz, MSW, [place] Work, Disposition  05/04/2015 [phone]

## 2015-05-04 NOTE — Progress Notes (Addendum)
Mellisa at [facility] to call back in am for updates on referral.  Authorization number obtained (call duration 1h 10 min) from April at [facility]: [id number] "starting today 05/04/15 through 5/19 for 7 days with the last day 5/19".  [facility] referral completed and faxed to [facility] at [facility]. Gave verbal report to [facility].  Audelia Acton at [facility] will contact TTS at [facility] at [phone] or [phone] or [phone] for updates on pt's referral.  CSW will continue to seek placement.  Verlon Setting, [place] Disposition staff 05/04/2015 10:46 PM

## 2015-05-05 MED ORDER — ACETAMINOPHEN 500 MG PO TABS
ORAL_TABLET | ORAL | Status: AC
  Filled 2015-05-05: qty 2

## 2015-05-05 MED ORDER — ONDANSETRON HCL 4 MG PO TABS: 4.0000 mg | ORAL_TABLET | Freq: Three times a day (TID) | ORAL | Status: DC | PRN

## 2015-05-05 MED ORDER — ACETAMINOPHEN 325 MG PO TABS: 650.0000 mg | ORAL_TABLET | ORAL | Status: DC | PRN

## 2015-05-05 MED ORDER — ACETAMINOPHEN 500 MG PO TABS
1000.0000 mg | ORAL_TABLET | Freq: Once | ORAL | Status: AC
  Administered 2015-05-05: 1000 mg via ORAL

## 2015-05-05 MED ORDER — LORAZEPAM 1 MG PO TABS
1.0000 mg | ORAL_TABLET | Freq: Three times a day (TID) | ORAL | Status: DC | PRN
  Administered 2015-05-06 – 2015-05-08 (×2): 1 mg via ORAL
  Filled 2015-05-05 (×2): qty 1

## 2015-05-05 MED ORDER — IBUPROFEN 400 MG PO TABS: 600.0000 mg | ORAL_TABLET | Freq: Three times a day (TID) | ORAL | Status: DC | PRN

## 2015-05-05 NOTE — Progress Notes (Signed)
Per [facility] admissions staff, medical team is reviewing Pt referral. Will call with further information.  Kyle Valenzuela, [place] 05/05/2015 10:50 AM NO:[id number]

## 2015-05-05 NOTE — ED Notes (Signed)
Pt given sprite to drink per request.

## 2015-05-05 NOTE — ED Notes (Signed)
Pack of peanut butter crackers and water given per request.

## 2015-05-05 NOTE — ED Notes (Signed)
Patient ambulatory to restroom. Steady gait.  

## 2015-05-05 NOTE — ED Notes (Signed)
Pending [facility] and [facility] is reviewing pt medically, concerned about renal fxn. To be reviewed by doctor this weekend.

## 2015-05-05 NOTE — ED Notes (Signed)
Patient c/o headache. Medicated with tylenol.

## 2015-05-06 DIAGNOSIS — F209 Schizophrenia, unspecified: Secondary | ICD-10-CM | POA: Diagnosis not present

## 2015-05-06 MED ORDER — SODIUM BICARBONATE 650 MG PO TABS
650.0000 mg | ORAL_TABLET | Freq: Two times a day (BID) | ORAL | Status: DC
  Administered 2015-05-06 – 2015-05-12 (×12): 650 mg via ORAL
  Filled 2015-05-06 (×29): qty 1

## 2015-05-06 MED ORDER — DIVALPROEX SODIUM 250 MG PO DR TAB
500.0000 mg | DELAYED_RELEASE_TABLET | Freq: Two times a day (BID) | ORAL | Status: DC
  Administered 2015-05-06 – 2015-05-12 (×12): 500 mg via ORAL
  Filled 2015-05-06 (×12): qty 2

## 2015-05-06 MED ORDER — POLYETHYLENE GLYCOL 3350 17 G PO PACK
17.0000 g | PACK | Freq: Every day | ORAL | Status: DC
  Administered 2015-05-06 – 2015-05-12 (×7): 17 g via ORAL
  Filled 2015-05-06 (×7): qty 1

## 2015-05-06 MED ORDER — CARVEDILOL 12.5 MG PO TABS
6.2500 mg | ORAL_TABLET | Freq: Two times a day (BID) | ORAL | Status: DC
  Administered 2015-05-06 – 2015-05-12 (×14): 6.25 mg via ORAL
  Filled 2015-05-06 (×14): qty 1

## 2015-05-06 MED ORDER — OLANZAPINE 5 MG PO TABS
10.0000 mg | ORAL_TABLET | Freq: Every day | ORAL | Status: DC
  Administered 2015-05-06 – 2015-05-12 (×7): 10 mg via ORAL
  Filled 2015-05-06 (×7): qty 2

## 2015-05-06 MED ORDER — TRAZODONE HCL 50 MG PO TABS
150.0000 mg | ORAL_TABLET | Freq: Every day | ORAL | Status: DC
  Administered 2015-05-06 – 2015-05-11 (×5): 150 mg via ORAL
  Filled 2015-05-06 (×5): qty 3

## 2015-05-06 MED ORDER — ALPRAZOLAM 0.5 MG PO TABS: 1.0000 mg | ORAL_TABLET | Freq: Four times a day (QID) | ORAL | Status: DC

## 2015-05-06 MED ORDER — AMLODIPINE BESYLATE 5 MG PO TABS
10.0000 mg | ORAL_TABLET | Freq: Every day | ORAL | Status: DC
  Administered 2015-05-06 – 2015-05-12 (×7): 10 mg via ORAL
  Filled 2015-05-06 (×7): qty 2

## 2015-05-06 NOTE — ED Notes (Addendum)
Patient awake. No distress.

## 2015-05-07 DIAGNOSIS — F209 Schizophrenia, unspecified: Secondary | ICD-10-CM | POA: Diagnosis not present

## 2015-05-07 NOTE — ED Notes (Signed)
Patient keeps stating he wants to go to [facility] room. Also stating he needs to get Mike's underwear. Patient directed to stay in room and that there is no Ronalee Belts here.

## 2015-05-07 NOTE — ED Notes (Signed)
Patient returned from shower  

## 2015-05-07 NOTE — ED Notes (Signed)
Patient lying in bed; occasionally gets up to use bathroom.

## 2015-05-07 NOTE — ED Notes (Signed)
Pt ate his meal tray and 2 glasses of sprite. Pt alert and oriented with NAD noted.

## 2015-05-07 NOTE — ED Notes (Signed)
Patient up to bathroom. Ambulated with no assistance or difficulty at this time.

## 2015-05-07 NOTE — ED Notes (Signed)
Patient requesting to take a shower.  Patient escorted to shower by Fritz Pickerel, Security.

## 2015-05-07 NOTE — BH Assessment (Signed)
[facility] Assessment Progress Note  Per Pamala Hurry at [facility] at 08:42, pt remains on their wait list.  Jalene Mullet, MA Triage Specialist [phone]

## 2015-05-07 NOTE — ED Notes (Signed)
Pt resting. Equal rise and fall of chest noted.    Pt ate his 80% of his lunch tray

## 2015-05-07 NOTE — Progress Notes (Signed)
Spoke with Crystal at [facility] who states one male bed on med psych unit may come available later today. CSW completed referral form and faxed to [facility] for review.  Declined at [facility] d/t age limit of 65+ for their med psych unit.  On [facility] waitlist as of 5/15. Confirmed again today by Pamala Hurry that pt remains on waitlist.  Sharren Bridge, MSW, [place] Work, Disposition  05/07/2015 [phone]

## 2015-05-08 DIAGNOSIS — F54 Psychological and behavioral factors associated with disorders or diseases classified elsewhere: Secondary | ICD-10-CM

## 2015-05-08 DIAGNOSIS — F209 Schizophrenia, unspecified: Secondary | ICD-10-CM | POA: Diagnosis not present

## 2015-05-08 DIAGNOSIS — R631 Polydipsia: Secondary | ICD-10-CM

## 2015-05-08 NOTE — ED Notes (Signed)
Pt is becoming more agitated. Keeps leaving his room looking for Mr. Juleen China. He said Mr. Juleen China has his $20.00 and he wants to make sure it is safe. Mr. Juleen China is the owner of the home where the pt lives. Pt has already spoken to Mr. Juleen China today because the pt thought he had a check at the residence that had not been cashed.

## 2015-05-08 NOTE — ED Notes (Signed)
Pt's mother at bedside.

## 2015-05-08 NOTE — ED Notes (Signed)
Patient has continually come out of his room stating someone is calling him.  Patient is carrying shower bucket.

## 2015-05-08 NOTE — Progress Notes (Addendum)
Per Catalina Pizza, DNP, continue seeking inpt placement for pt due to risk of harm to self.  Contacted [facility] and spoke with Crystal, who states it is possible there will be male bed available on med psych unit later today. CSW re-faxed referral for review.  Contacted [facility] and was informed by Ishmael Holter that pt remains on wait list.  Contacted [facility], where pt's referral (faxed yesterday 5/16) has not been reviewed yet per Shirlee Limerick. She states it will not be reviewed until beds are available on the unit. (Pt's referral was not accepted when faxed on 05/03/15)   Declined at: [place] Zap, MSW, [place] Work, Disposition  05/08/2015 [phone]

## 2015-05-08 NOTE — ED Notes (Signed)
TTS in progress 

## 2015-05-08 NOTE — ED Notes (Signed)
Patient wandering in and out of his room. States that he wants to go outside and walk around. Verbal redirecting patient went back to his room.

## 2015-05-08 NOTE — Consult Note (Signed)
[facility] Telepsychiatry consult  Reason for Consult:  Bizarre behavior Referring Physician:  EDP Patient Identification: Kyle Valenzuela MRN:  [id number] Principal Diagnosis: Schizophrenia, unspecified type Diagnosis:   Patient Active Problem List   Diagnosis Date Noted  . Schizophrenia, unspecified type [F20.9]     Priority: High  . 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 End stage renal disease [N18.6] 03/01/2012    Total Time spent on patient: 50 minutes  Subjective:   Kyle Valenzuela is a 54 y.o. male patient admitted with reports of bizarre behavior and drinking out of toilets, water bowls for pets, and stealing drinks from other residents while they were drinking them. This NP spoke to [facility] staff who reported these incidents to me in detail. I was informed that event he outdoor spickets had to have the handles taken off. Pt denies suicidal/homicidal ideation and psychosis. However, pt's behaviors are a danger to himself and the collateral information from the group home is sufficient to warrant concern for his medical safety, necessitating inpatient psychiatric hospitalization/commitment.    HPI:   Kyle Valenzuela is an 54 y.o. male was brought [facility] today after getting upset about not being able to drink any more water. Pt reports that he is a resident at [facility] which is run by Mr. Kyle Valenzuela. Pt has been at [facility] since 2013. Pt denies SI, HI, depression, Anxiety, A/V hallucinations, and substance abuse. Pt reports that he is good and nothing is wrong. Pt was apologetic and remorseful during assessment. Per chat review, pt has been to [facility] in 2007 and in 2010.    Per Mr. Kyle Valenzuela, group home owner, pt has been drinking water non-stop, even out of the toilets and dog bowl. Pt has been upset about not getting to drink any water and has been mildly aggressive and defiant towards the staff. Pt will respond to internal stimuli at  times and has a history of schizophrenia. Pt sees [facility] Hoyle Barr at [facility] for medication management. 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 Per Mr. Kyle Valenzuela, pt hardly sleeps at all and is very anxious, pacing the halls. Per Mr. Kyle Valenzuela, pt needs help sleeping and a possible medication adjustment.   Per Dr. Parke Poisson, pt meets criteria for inpatient treatment. Per Debarah Crape, RN, [facility] due to medical acuity. Placement will be sought for Kyle Valenzuela.  Past Medical History:  Past Medical History  Diagnosis Date  . Chronic kidney disease   . COPD (chronic obstructive pulmonary disease)   . Hyperglycemia   . Schizophrenia   . Hypertension   . Polydipsia     Past Surgical History  Procedure Laterality Date  . Tonsillectomy      as a child  . Multiple tooth extractions    . Av fistula placement  04/22/2012    Procedure: ARTERIOVENOUS (AV) FISTULA CREATION;  Surgeon: Serafina Mitchell, MD;  Location: [facility];  Service: Vascular;  Laterality: Right;  . Av fistula placement  07/30/12    Right Ligation of competing branches  AVF   Family History: No family history on file. Social History:  History  Alcohol Use No     History  Drug Use No    History   Social History  . Marital Status: Single    Spouse Name: N/A  . Number of Children: N/A  . Years of Education: N/A   Social History  Main Topics  . Smoking status: Former Smoker -- 0.50 packs/day    Types: Cigarettes  . Smokeless tobacco: Never Used     Comment: 7 cigs per day  . Alcohol Use: No  . Drug Use: No  . Sexual Activity: Not on file   Other Topics Concern  . None   Social History Narrative   Additional Social History:    Pain Medications: pt denies Prescriptions: pt denies Over the Counter: pt denies History of alcohol / drug use?: No history of alcohol / drug abuse                     Allergies:  No Known Allergies  Labs: No results found for this or any previous visit  (from the past 48 hour(s)).  Vitals: Blood pressure 120/88, pulse 80, temperature 97.5 F (36.4 C), temperature source Oral, resp. rate 17, height 5\' 8"  (1.727 m), weight 81.647 kg (180 lb), SpO2 100 %.  Risk to Self: Suicidal Ideation: No Suicidal Intent: No Is patient at risk for suicide?: No Suicidal Plan?: No Access to Means: No Triggers for Past Attempts: None known Risk to Others: Homicidal Ideation: No Thoughts of Harm to Others: No Current Homicidal Intent: No Current Homicidal Plan: No Access to Homicidal Means: No History of harm to others?: No Assessment of Violence: None Noted Does patient have access to weapons?: No Criminal Charges Pending?: No Does patient have a court date: No Prior Inpatient Therapy: Prior Inpatient Therapy: Yes Prior Therapy Dates: 2007, 2010 Prior Therapy Fac[facility]Provider(s): [facility] Reason for Treatment: schizophrenia Prior Outpatient Therapy: Prior Outpatient Therapy: Yes Prior Therapy Dates: ongoing Prior Therapy Facilty/Provider(s): dr. lay Reason for Treatment: schizophrenia Does patient have an ACCT team?: No Does patient have Intensive In-House Services?  : No Does patient have [facility] services? : No Does patient have P4CC services?: No  Current Facility-Administered Medications  Medication Dose Route Frequency Provider Last Rate Last Dose  . acetaminophen (TYLENOL) tablet 650 mg  650 mg Oral Q4H PRN Francine Graven, DO      . amLODipine (NORVASC) tablet 10 mg  10 mg Oral Daily Ezequiel [facility], MD   10 mg at 05/08/15 0759  . carvedilol (COREG) tablet 6.25 mg  6.25 mg Oral BID WC Ezequiel [facility], MD   6.25 mg at 05/08/15 0757  . divalproex (DEPAKOTE) DR tablet 500 mg  500 mg Oral BID Ezequiel [facility], MD   500 mg at 05/08/15 0803  . ibuprofen (ADVIL,MOTRIN) tablet 600 mg  600 mg Oral Q8H PRN Francine Graven, DO      . LORazepam (ATIVAN) tablet 1 mg  1 mg Oral Q8H PRN Francine Graven, DO   1 mg at 05/08/15 1214  . OLANZapine (ZYPREXA)  tablet 10 mg  10 mg Oral Daily Ezequiel [facility], MD   10 mg at 05/08/15 0757  . ondansetron (ZOFRAN) tablet 4 mg  4 mg Oral Q8H PRN Francine Graven, DO      . polyethylene glycol (MIRALAX / GLYCOLAX) packet 17 g  17 g Oral Daily Ezequiel [facility], MD   17 g at 05/08/15 0800  . sodium bicarbonate tablet 650 mg  650 mg Oral BID Ezequiel [facility], MD   650 mg at 05/08/15 0804  . traZODone (DESYREL) tablet 150 mg  150 mg Oral QHS Ezequiel [facility], MD   150 mg at 05/07/15 2143   Current Outpatient Prescriptions  Medication Sig Dispense Refill  . acetaminophen-codeine (TYLENOL #3) 300-30 MG per tablet Take 1 tablet by  mouth every 4 (four) hours as needed for moderate pain.    Marland Kitchen albuterol (PROVENTIL HFA;VENTOLIN HFA) 108 (90 BASE) MCG/ACT inhaler Inhale 2 puffs into the lungs every 4 (four) hours as needed. For shortness of breath    . ALPRAZolam (XANAX) 1 MG tablet Take 1 mg by mouth 4 (four) times daily.     Marland Kitchen amLODipine (NORVASC) 10 MG tablet Take 10 mg by mouth daily.    . carvedilol (COREG) 6.25 MG tablet Take 6.25 mg by mouth 2 (two) times daily with a meal.    . divalproex (DEPAKOTE) 500 MG DR tablet Take 500 mg by mouth 2 (two) times daily.    Marland Kitchen OLANZapine (ZYPREXA) 10 MG tablet Take 10 mg by mouth daily.    . polyethylene glycol powder (GLYCOLAX/MIRALAX) powder Take 17 g by mouth daily.    . sodium bicarbonate 650 MG tablet Take 650 mg by mouth 2 (two) times daily.    . traZODone (DESYREL) 150 MG tablet Take 150 mg by mouth at bedtime.    Marland Kitchen LORazepam (ATIVAN) 2 MG tablet Give 1 tablet at bedtime. May give 1 tablet each day at noon for hyperactivity (Patient not taking: Reported on 05/03/2015) 30 tablet 0    Musculoskeletal: Strength & Muscle Tone: within normal limits Gait & Station: normal Patient leans: N/A  Psychiatric Specialty Exam: Physical Exam  ROS  Blood pressure 120/88, pulse 80, temperature 97.5 F (36.4 C), temperature source Oral, resp. rate 17, height 5\' 8"  (1.727 m), weight  81.647 kg (180 lb), SpO2 100 %.Body mass index is 27.38 kg/(m^2).  General Appearance: Casual and Fairly Groomed  Engineer, water::  Fair  Speech:  Pressured  Volume:  Increased  Mood:  Anxious  Affect:  Non-Congruent  Thought Process:  Tangential  Orientation:  Full (Time, Place, and Person)  Thought Content:  Rumination  Suicidal Thoughts:  No  Homicidal Thoughts:  No  Memory:  Immediate;   Fair Recent;   Fair Remote;   Fair  Judgement:  Fair  Insight:  Fair  Psychomotor Activity:  Increased  Concentration:  Poor  Recall:  Poor  Fund of Knowledge:Poor  Language: Fair  Akathisia:  No  Handed:    AIMS (if indicated):     Assets:  Desire for Improvement Resilience Social Support  ADL's:  Intact  Cognition: WNL  Sleep:      Treatment Plan Summary: Schizophrenia, unspecified type unstable and not responding to current medication management: see below: inpatient  Disposition:  -Inpatient psychiatric hospitalization for safety and stabilization  Benjamine Mola, FNP-BC 05/08/2015 09:22 AM   Patient case discussed with me as above  Neita Garnet, MD

## 2015-05-08 NOTE — Progress Notes (Addendum)
Patient has been referred for IP treatment at: [facility] - per [place], who states it is possible there will be male bed available on med psych unit later today. Referral re-faxed. Per [place], at capacity tonight and no d/c tonight, check back after 10am after MD's proceed with d/c's. [facility] - per Ishmael Holter, pt remains on wait list. [facility] - per Shirlee Limerick, at capacity today, check back in am. Referral was faxed yesterday 5/16.  Declined at: [facility] will continue to seek placement.  Verlon Setting, [place] [facility] Disposition staff 05/08/2015 5:21 PM

## 2015-05-09 DIAGNOSIS — F209 Schizophrenia, unspecified: Secondary | ICD-10-CM | POA: Diagnosis not present

## 2015-05-09 NOTE — ED Provider Notes (Signed)
Pt resting comfortably He is easily arousable He is awaiting placement  BP 106/69 mmHg  Pulse 82  Temp(Src) 98.3 F (36.8 C) (Oral)  Resp 17  Ht 5\' 8"  (1.727 m)  Wt 180 lb (81.647 kg)  BMI 27.38 kg/m2  SpO2 100%   Ripley Fraise, MD 05/09/15 0210

## 2015-05-09 NOTE — Progress Notes (Addendum)
CSW followed up with placement efforts.  Remains on [facility] waitlist per [place]. Was added to wait list 05/06/15.  [facility] med psych unit at capacity today per [place]. Advises to call back tomorrow (5/19) morning after 10am.   Faxed to [facility] (per [place]) and [facility] (per Ginger).  Declined at: [facility], MSW, [place] Work, Disposition  05/09/2015   13:47- Spoke with Carlyon Shadow at [facility]- states re-fax referral for review. CSW faxed referral with updated notes. Darlene called back to say pt's acuity is too high to be considered for any open beds today, but she will hold referral in case of high acuity beds tomorrow- advised CSW call back 05/10/15 morning.   Sharren Bridge, MSW, [facility] Clinical Social Work, Disposition  05/09/2015 [phone]   [phone]

## 2015-05-10 DIAGNOSIS — F209 Schizophrenia, unspecified: Secondary | ICD-10-CM | POA: Diagnosis not present

## 2015-05-10 NOTE — BHH Counselor (Signed)
TTS Counselor spoke with pt's attending RN and need for re-assessment. Counselor asked if tele-assessment cart could be placed in pt's room in preparation for [facility] assessment. Counselor also reviewed pt's chart and MD notes in preparation for re-assessment.  Re-assessment to begin shortly.   Ramond Dial, [facility] Triage Specialist

## 2015-05-10 NOTE — Progress Notes (Signed)
CSW followed up with placement efforts.CSW followed up with placement efforts.  Remains on [facility] waitlist per Marlowe Kays. (was added to list 05/06/15)  [facility]- per Carlyon Shadow, has held on to referral (faxed yesterday 05/09/15) and will consider if an appropriate bed comes open  [facility]- per Jamal Collin, fax referral for review in case beds come available today  At [facility]- per [facility]- per Vicie Mutters- per [facility]. [facility]- per [place] [facility]- per [facility]  Declined at: [facility]  Sharren Bridge, MSW, [place] Work, Disposition  05/10/2015 [phone]

## 2015-05-10 NOTE — BH Assessment (Addendum)
Tele Assessment Note   Kyle Valenzuela is a 54 y.o. male originally brought to [facility] after getting upset about not being able to drink any more water. Pt is a resident at [facility], which is run by Mr. Wallis. Pt says he is not sure why he is here in the ED. Pt has been residing at this home since 2013. Pt continues to deny SI, HI, depression, and anxiety. However, he is extremely irritable, paranoid, and sometimes uncooperative with the re-assessment. He denies A/VH and SA. Pt repeatedly states "I'm okay" and asks the counselor how much longer he will have to remain in the hospital because he wants to leave. Pt says that he has never attempted suicide in his life; however, per chat review, pt has been to [facility] in 2007 and in 2010. Per group home owner, Mr. Hollace Hayward, pt had been drinking water non-stop, even out of the toilets and dog bowl. Pt has been upset about not getting to drink any water and has been mildly aggressive and defiant towards the staff. Pt will respond to internal stimuli at times and has a history of schizophrenia. Pt sees [facility] Hoyle Barr at [facility] for medication management. 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 Per Mr. Wallis, pt sleeps very little and is very anxious, pacing the halls at the care home. Per Mr. Wallis, pt needs help sleeping and a possible medication adjustment. Pt acknowledges that he often socially isolates and is sometimes irritable.   Pt continues to meet criteria for inpatient treatment. Per Debarah Crape, RN, pt is declined at [facility] due to medical acuity. Placement is currently being sought at other facilities; he is currently on the [facility] and [facility] waitlist.   Axis I: Schizophrenia, by hx; Unspecified Anxiety Disorder, by hx Axis II: Deferred Axis III:  Past Medical History  Diagnosis Date  . Chronic kidney disease   . COPD (chronic obstructive pulmonary disease)   . Hyperglycemia   . Schizophrenia   . Hypertension    . Polydipsia    Axis IV: economic problems, educational problems, housing problems, occupational problems, other psychosocial or environmental problems, problems related to legal system/crime, problems related to social environment, problems with access to health care services and problems with primary support group Axis V: 21-30 behavior considerably influenced by delusions or hallucinations OR serious impairment in judgment, communication OR inability to function in almost all areas  Past Medical History:  Past Medical History  Diagnosis Date  . Chronic kidney disease   . COPD (chronic obstructive pulmonary disease)   . Hyperglycemia   . Schizophrenia   . Hypertension   . Polydipsia     Past Surgical History  Procedure Laterality Date  . Tonsillectomy      as a child  . Multiple tooth extractions    . Av fistula placement  04/22/2012    Procedure: ARTERIOVENOUS (AV) FISTULA CREATION;  Surgeon: Serafina Mitchell, MD;  Location: [facility];  Service: Vascular;  Laterality: Right;  . Av fistula placement  07/30/12    Right Ligation of competing branches  AVF    Family History: No family history on file.  Social History:  reports that he has quit smoking. His smoking use included Cigarettes. He smoked 0.50 packs per day. He has never used smokeless tobacco. He reports that he does not drink alcohol or use illicit drugs.  Additional Social History:  Alcohol / Drug Use Pain Medications: pt denies Prescriptions: pt  denies Over the Counter: pt denies History of alcohol / drug use?: No history of alcohol / drug abuse  CIWA: CIWA-Ar BP: 139/90 mmHg Pulse Rate: 77 COWS:    PATIENT STRENGTHS: (choose at least two) Average or above average intelligence Physical Health Supportive family/friends  Allergies: No Known Allergies  Home Medications:  (Not in a hospital admission)  OB/GYN Status:  No LMP for male patient.  General Assessment Data Location of Assessment: [facility] TTS  Assessment: In system Is this a Tele or Face-to-Face Assessment?: Tele Assessment Is this an Initial Assessment or a Re-assessment for this encounter?: Initial Assessment Marital status: Single Is patient pregnant?: No Pregnancy Status: No Living Arrangements: Group Home ([facility], for 4 years) Can pt return to current living arrangement?: Yes Admission Status: Voluntary Is patient capable of signing voluntary admission?: Yes Referral Source: Self/Family/Friend  Medical Screening Exam [facility] Walk-in ONLY) Medical Exam completed: Yes  Crisis Care Plan Living Arrangements: Group Home ([facility], for 4 years)  Education Status Is patient currently in school?: No Highest grade of school patient has completed: 12  Risk to self with the past 6 months Suicidal Ideation: No Has patient been a risk to self within the past 6 months prior to admission? : No Suicidal Intent: No Has patient had any suicidal intent within the past 6 months prior to admission? : No Is patient at risk for suicide?: No Suicidal Plan?: No Has patient had any suicidal plan within the past 6 months prior to admission? : No Access to Means: No Previous Attempts/Gestures: No Triggers for Past Attempts: None known Family Suicide History: No Recent stressful life event(s): Other (Comment) (not being able to get water when he wants it) Persecutory voices/beliefs?: No Depression: No Substance abuse history and/or treatment for substance abuse?: No Suicide prevention information given to non-admitted patients: Not applicable  Risk to Others within the past 6 months Homicidal Ideation: No Does patient have any lifetime risk of violence toward others beyond the six months prior to admission? : No Thoughts of Harm to Others: No Current Homicidal Intent: No Current Homicidal Plan: No Access to Homicidal Means: No History of harm to others?: No Assessment of Violence: None Noted Does  patient have access to weapons?: No Criminal Charges Pending?: No Does patient have a court date: No Is patient on probation?: No  Psychosis Hallucinations: None noted Delusions: None noted  Mental Status Report Appearance/Hygiene: In hospital gown, Unremarkable Eye Contact: Fair Motor Activity: Freedom of movement, Restlessness, Unremarkable Speech: Logical/coherent, Unremarkable Level of Consciousness: Alert Mood: Anxious Anxiety Level: None Thought Processes: Relevant, Coherent, Tangential Judgement: Impaired Orientation: Person, Place, Time, Situation  Cognitive Functioning Concentration: Decreased Memory: Recent Impaired, Remote Intact Impulse Control: Poor Appetite: Good Weight Loss: 10 Sleep: Decreased Total Hours of Sleep: 5 (pacing, changes his mind, wondering,) Vegetative Symptoms: None  ADLScreening [facility] Assessment Services) Patient's cognitive ability adequate to safely complete daily activities?: No Patient able to express need for assistance with ADLs?: Yes Independently performs ADLs?: No  Prior Inpatient Therapy Prior Inpatient Therapy: Yes Prior Therapy Dates: 2007, 2010 Prior [facility] Facilty[facility]Provider(s): [facility] Reason for Treatment: schizophrenia  Prior Outpatient Therapy Prior Outpatient Therapy: Yes Prior Therapy Dates: ongoing Prior Therapy Facilty/Provider(s): dr. lay Reason for Treatment: schizophrenia Does patient have an ACCT team?: No Does patient have Intensive In-House Services?  : No Does patient have [facility] services? : No Does patient have P4CC services?: No  ADL Screening (condition at time of  admission) Patient's cognitive ability adequate to safely complete daily activities?: No Is the patient deaf or have difficulty hearing?: No Does the patient have difficulty seeing, even when wearing glasses/contacts?: No Does the patient have difficulty concentrating, remembering, or making decisions?: No Patient able to express need for  assistance with ADLs?: Yes Does the patient have difficulty dressing or bathing?: Yes Independently performs ADLs?: No Communication: Independent Dressing (OT): Needs assistance Grooming: Independent Feeding: Independent Bathing: Needs assistance Toileting: Needs assistance In/Out Bed: Independent Does the patient have difficulty walking or climbing stairs?: No       Abuse/Neglect Assessment (Assessment to be complete while patient is alone) Physical Abuse: Denies Verbal Abuse: Denies Sexual Abuse: Denies Exploitation of patient/patient's resources: Denies Self-Neglect: Denies Values / Beliefs Cultural Requests During Hospitalization: None Spiritual Requests During Hospitalization: None Consults Spiritual Care Consult Needed: No Social Work Consult Needed: No Regulatory affairs officer (For Healthcare) Does patient have an advance directive?: No Would patient like information on creating an advanced directive?: No - patient declined information (sister is medical power of attorney)    Additional Information 1:1 In Past 12 Months?: No CIRT Risk: No Elopement Risk: No Does patient have medical clearance?: Yes     Disposition: Pt continues to meet criteria for inpatient treatment. Per Debarah Crape, RN, pt is declined at [facility] due to medical acuity. Placement is currently being sought at other facilities; he is currently on the [facility] and [facility] waitlist.  Disposition Initial Assessment Completed for this Encounter: Yes Disposition of Patient: Other dispositions  Ramond Dial, [facility] Triage Specialist  05/10/2015 10:05 PM

## 2015-05-10 NOTE — Progress Notes (Signed)
Writer advised RN at [facility] that patient needs a new blood withdrawal.  Verlon Setting, [facility] Disposition staff 05/10/2015 11:18 PM

## 2015-05-11 DIAGNOSIS — F209 Schizophrenia, unspecified: Secondary | ICD-10-CM | POA: Diagnosis not present

## 2015-05-11 LAB — BASIC METABOLIC PANEL
Anion gap: 10 (ref 5–15)
BUN: 48 mg/dL — ABNORMAL HIGH (ref 6–20)
CHLORIDE: 101 mmol/L (ref 101–111)
CO2: 25 mmol/L (ref 22–32)
CREATININE: 4.19 mg/dL — AB (ref 0.61–1.24)
Calcium: 8.6 mg/dL — ABNORMAL LOW (ref 8.9–10.3)
GFR calc Af Amer: 17 mL/min — ABNORMAL LOW (ref >60)
GFR calc non Af Amer: 15 mL/min — ABNORMAL LOW (ref >60)
GLUCOSE: 84 mg/dL (ref 65–99)
Potassium: 4.2 mmol/L (ref 3.5–5.1)
SODIUM: 136 mmol/L (ref 135–145)

## 2015-05-11 NOTE — Progress Notes (Signed)
Followed up with [facility] re: bed status. Vinnie Level states there is small possibility of a male bed becoming open later today and to fax referral. Vinnie Level advises referral may not be reviewed as there are many referrals and only one possible bed. States she will call back if referral is reviewed.  Sharren Bridge, MSW, [place] Work, Disposition  05/11/2015 [phone]

## 2015-05-11 NOTE — ED Notes (Signed)
Offered pt shower, pt refused at this time.

## 2015-05-11 NOTE — BH Assessment (Signed)
Per Aretha Parrot will be reviewing pt for possible acceptance as they have 5 discharges later.   Lear Ng, [facility] Triage Specialist 05/11/2015 5:17 AM

## 2015-05-11 NOTE — Progress Notes (Signed)
CSW following up on placement efforts. As pt's last labs are from 05/03/15, [facility] was advised per 05/10/15 note that new labs need to be drawn to update referrals.  Remains on [facility] waitlist per Roque Cash. Will need updated labs faxed when completed.  Faxed referral to: [facility] per Jonathon Jordan- per [place]  At capacity: [facility]- per Vinnie Level but call back after 2pm for update on bed status  Declined at: [facility]- per Shirlee Limerick d/t medical acuity [facility]- per Carlyon Shadow d/t medical acuity [facility]- d/t renal failure [facility]- per Kerry Dory d/t medical acuity [facility]- d/t medical acuity per [facility]- per [facility] psych has age requirement of 65+ and not appropriate for adult unity d/t medical needs [facility]- per Ronalee Belts d/t medical acuity [facility]- age of 65+ required for med psych unit  Sharren Bridge, MSW, [place] [facility] Work, Disposition  05/11/2015 [phone]

## 2015-05-11 NOTE — BHH Counselor (Signed)
Pt continues to meet criteria for inpatient treatment. Per Debarah Crape, RN, pt is declined at [facility] due to medical acuity. Placement is currently being sought at other facilities; he is currently on the [facility] and [facility] waitlist.    Ramond Dial, [facility] Triage Specialist

## 2015-05-11 NOTE — ED Notes (Signed)
Patient taking shower at this time. ED tech assisting. Security standing by.

## 2015-05-12 DIAGNOSIS — F05 Delirium due to known physiological condition: Secondary | ICD-10-CM

## 2015-05-12 DIAGNOSIS — F209 Schizophrenia, unspecified: Secondary | ICD-10-CM | POA: Diagnosis not present

## 2015-05-12 NOTE — ED Notes (Signed)
Spoke with [place], pt also at [facility]. States Mr. Juleen China did not do a safe discharge and the patient has to have notice of the discharge with the right to appeal. Mr. Juleen China made aware and requested the number to DSS

## 2015-05-12 NOTE — ED Provider Notes (Signed)
Patient resting comfortably. He has been cleared by psychiatry back to his group home. He denies any suicidal or homicidal thoughts.  BP 133/88 mmHg  Pulse 99  Temp(Src) 98.7 F (37.1 C) (Oral)  Resp 16  Ht 5\' 8"  (1.727 m)  Wt 180 lb (81.647 kg)  BMI 27.38 kg/m2  SpO2 100%   Ezequiel Essex, MD 05/12/15 1725

## 2015-05-12 NOTE — Consult Note (Signed)
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 Schizophrenia, unspecified type [F20.9]     Priority: High  . 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 End stage renal disease [N18.6] 03/01/2012    Total Time spent on patient: 50 minutes  Subjective:   Kyle Valenzuela is a 54 y.o. male patient admitted with reports of bizarre behavior and drinking out of toilets, water bowls for pets, and stealing drinks from other residents while they were drinking them. Pt known to this NP. Pt seen and chart reviewed. Pt has been well-behaved in the ED for 10 days and has not yet been placed due to his renal concerns. However, pt is likely at baseline at this time given his current presentation. Pt is alert/oriented x4, denies suicidal/homicidal ideation and psychosis and does not appear to be responding to internal stimuli. ED staff report that pt has been doing very well for several days.   HPI:   Kyle Valenzuela is an 54 y.o. male was brought to [facility] today after getting upset about not being able to drink any more water. Pt reports that he is a resident at [facility] which is run by Mr. Wallis. Pt has been at [facility] since 2013. Pt denies SI, HI, depression, Anxiety, A/V hallucinations, and substance abuse. Pt reports that he is good and nothing is wrong. Pt was apologetic and remorseful during assessment. Per chat review, pt has been to [facility] in 2007 and in 2010.    Per Mr. Wallis, group home owner, pt has been drinking water non-stop, even out of the toilets and dog bowl. Pt has been upset about not getting  to drink any water and has been mildly aggressive and defiant towards the staff. Pt will respond to internal stimuli at times and has a history of schizophrenia. Pt sees Dr. Lay at [facility] for medication management. 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 Per Mr. Wallis, pt hardly sleeps at all and is very anxious, pacing the halls. Per Mr. Wallis, pt needs help sleeping and a possible medication adjustment.   On 05/03/15, This NP spoke to [facility] staff who reported these incidents to me in detail. I was informed that event he outdoor spickets had to have the handles taken off. Pt denies suicidal/homicidal ideation and psychosis. However, pt's behaviors are a danger to himself and the collateral information from the group home is sufficient to warrant concern for his medical safety, necessitating inpatient psychiatric hospitalization/commitment.    Past Medical History:  Past Medical History  Diagnosis Date  . Chronic kidney disease   . COPD (chronic obstructive pulmonary disease)   . Hyperglycemia   . Schizophrenia   . Hypertension   . Polydipsia     Past Surgical History  Procedure Laterality Date  . Tonsillectomy      as a child  . Multiple tooth extractions    . Av fistula placement  04/22/2012    Procedure: ARTERIOVENOUS (AV) FISTULA CREATION;  Surgeon: Vance W Brabham, MD;  Location: [facility] OR;  Service: Vascular;  Laterality: Right;  .   Av fistula placement  07/30/12    Right Ligation of competing branches  AVF   Family History: No family history on file. Social History:  History  Alcohol Use No     History  Drug Use No    History   Social History  . Marital Status: Single    Spouse Name: N/A  . Number of Children: N/A  . Years of Education: N/A   Social History Main Topics  . Smoking status: Former Smoker -- 0.50 packs/day    Types: Cigarettes  . Smokeless tobacco: Never Used     Comment: 7 cigs per day  . Alcohol Use:  No  . Drug Use: No  . Sexual Activity: Not on file   Other Topics Concern  . None   Social History Narrative   Additional Social History:    Pain Medications: pt denies Prescriptions: pt denies Over the Counter: pt denies History of alcohol / drug use?: No history of alcohol / drug abuse                     Allergies:  No Known Allergies  Labs:  Results for orders placed or performed during the hospital encounter of 05/03/15 (from the past 48 hour(s))  Basic metabolic panel     Status: Abnormal   Collection Time: 05/11/15  3:23 PM  Result Value Ref Range   Sodium 136 135 - 145 mmol/L   Potassium 4.2 3.5 - 5.1 mmol/L   Chloride 101 101 - 111 mmol/L   CO2 25 22 - 32 mmol/L   Glucose, Bld 84 65 - 99 mg/dL   BUN 48 (H) 6 - 20 mg/dL   Creatinine, Ser 4.19 (H) 0.61 - 1.24 mg/dL   Calcium 8.6 (L) 8.9 - 10.3 mg/dL   GFR calc non Af Amer 15 (L) >60 mL/min   GFR calc Af Amer 17 (L) >60 mL/min    Comment: (NOTE) The eGFR has been calculated using the CKD EPI equation. This calculation has not been validated in all clinical situations. eGFR's persistently <60 mL/min signify possible Chronic Kidney Disease.    Anion gap 10 5 - 15    Vitals: Blood pressure 147/86, pulse 91, temperature 98.4 F (36.9 C), temperature source Oral, resp. rate 18, height 5' 8" (1.727 m), weight 81.647 kg (180 lb), SpO2 100 %.  Risk to Self: Suicidal Ideation: No Suicidal Intent: No Is patient at risk for suicide?: No Suicidal Plan?: No Access to Means: No Triggers for Past Attempts: None known Risk to Others: Homicidal Ideation: No Thoughts of Harm to Others: No Current Homicidal Intent: No Current Homicidal Plan: No Access to Homicidal Means: No History of harm to others?: No Assessment of Violence: None Noted Does patient have access to weapons?: No Criminal Charges Pending?: No Does patient have a court date: No Prior Inpatient Therapy: Prior Inpatient Therapy: Yes Prior Therapy  Dates: 2007, 2010 Prior Therapy Facilty[facility]Provider(s): [facility] Reason for Treatment: schizophrenia Prior Outpatient Therapy: Prior Outpatient Therapy: Yes Prior Therapy Dates: ongoing Prior Therapy Facilty/Provider(s): dr. lay Reason for Treatment: schizophrenia Does patient have an ACCT team?: No Does patient have Intensive In-House Services?  : No Does patient have [facility] services? : No Does patient have P4CC services?: No  Current Facility-Administered Medications  Medication Dose Route Frequency Provider Last Rate Last Dose  . acetaminophen (TYLENOL) tablet 650 mg  650 mg Oral Q4H PRN Kathleen McManus, DO      . amLODipine (NORVASC) tablet 10 mg    10 mg Oral Daily Stephen Rancour, MD   10 mg at 05/11/15 0930  . carvedilol (COREG) tablet 6.25 mg  6.25 mg Oral BID WC Stephen Rancour, MD   6.25 mg at 05/12/15 0730  . divalproex (DEPAKOTE) DR tablet 500 mg  500 mg Oral BID Stephen Rancour, MD   500 mg at 05/11/15 2208  . LORazepam (ATIVAN) tablet 1 mg  1 mg Oral Q8H PRN Kathleen McManus, DO   1 mg at 05/08/15 1214  . OLANZapine (ZYPREXA) tablet 10 mg  10 mg Oral Daily Stephen Rancour, MD   10 mg at 05/11/15 0931  . ondansetron (ZOFRAN) tablet 4 mg  4 mg Oral Q8H PRN Kathleen McManus, DO      . polyethylene glycol (MIRALAX / GLYCOLAX) packet 17 g  17 g Oral Daily Stephen Rancour, MD   17 g at 05/11/15 0930  . sodium bicarbonate tablet 650 mg  650 mg Oral BID Stephen Rancour, MD   650 mg at 05/11/15 2208  . traZODone (DESYREL) tablet 150 mg  150 mg Oral QHS Stephen Rancour, MD   150 mg at 05/11/15 2208   Current Outpatient Prescriptions  Medication Sig Dispense Refill  . acetaminophen-codeine (TYLENOL #3) 300-30 MG per tablet Take 1 tablet by mouth every 4 (four) hours as needed for moderate pain.    . albuterol (PROVENTIL HFA;VENTOLIN HFA) 108 (90 BASE) MCG/ACT inhaler Inhale 2 puffs into the lungs every 4 (four) hours as needed. For shortness of breath    . ALPRAZolam (XANAX) 1 MG tablet Take  1 mg by mouth 4 (four) times daily.     . amLODipine (NORVASC) 10 MG tablet Take 10 mg by mouth daily.    . carvedilol (COREG) 6.25 MG tablet Take 6.25 mg by mouth 2 (two) times daily with a meal.    . divalproex (DEPAKOTE) 500 MG DR tablet Take 500 mg by mouth 2 (two) times daily.    . OLANZapine (ZYPREXA) 10 MG tablet Take 10 mg by mouth daily.    . polyethylene glycol powder (GLYCOLAX/MIRALAX) powder Take 17 g by mouth daily.    . sodium bicarbonate 650 MG tablet Take 650 mg by mouth 2 (two) times daily.    . traZODone (DESYREL) 150 MG tablet Take 150 mg by mouth at bedtime.    . LORazepam (ATIVAN) 2 MG tablet Give 1 tablet at bedtime. May give 1 tablet each day at noon for hyperactivity (Patient not taking: Reported on 05/03/2015) 30 tablet 0    Musculoskeletal: UTO, camera  Psychiatric Specialty Exam: Physical Exam  Review of Systems  Psychiatric/Behavioral: Negative for depression, suicidal ideas and hallucinations. The patient is not nervous/anxious.   All other systems reviewed and are negative.   Blood pressure 147/86, pulse 91, temperature 98.4 F (36.9 C), temperature source Oral, resp. rate 18, height 5' 8" (1.727 m), weight 81.647 kg (180 lb), SpO2 100 %.Body mass index is 27.38 kg/(m^2).  General Appearance: Casual and Fairly Groomed  Eye Contact::  Good  Speech:  Clear and Coherent and Normal Rate  Volume:  Increased  Mood:  Euthymic  Affect:  Appropriate and Congruent  Thought Process:  Coherent and Goal Directed  Orientation:  Full (Time, Place, and Person)  Thought Content:  WDL  Suicidal Thoughts:  No  Homicidal Thoughts:  No  Memory:  Immediate;   Fair Recent;   Fair Remote;   Fair  Judgement:  Fair  Insight:  Fair  Psychomotor Activity:  Normal  Concentration:    Good  Recall:  Poor  Fund of Knowledge:Poor  Language: Fair  Akathisia:  No  Handed:    AIMS (if indicated):     Assets:  Desire for Improvement Resilience Social Support  ADL's:  Intact   Cognition: WNL  Sleep:      Treatment Plan Summary: 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   Disposition:  -Discharge home to group home.   WiBenjamine MolaFNP-BC 05/12/2015 09:12 AM  I agreed with the findings, treatment and disposition plan of this patient. SyBerniece AndreasMD

## 2015-05-12 NOTE — Discharge Instructions (Signed)
Schizophrenia Followup with your doctor regarding your kidney problems. Return to the ED if you develop new or worsening symptoms. Schizophrenia is a mental illness. It may cause disturbed or disorganized thinking, speech, or behavior. People with schizophrenia have problems functioning in one or more areas of life: work, school, home, or relationships. People with schizophrenia are at increased risk for suicide, certain chronic physical illnesses, and unhealthy behaviors, such as smoking and drug use. People who have family members with schizophrenia are at higher risk of developing the illness. Schizophrenia affects men and women equally but usually appears at an earlier age (teenage or early adult years) in men.  SYMPTOMS The earliest symptoms are often subtle (prodrome) and may go unnoticed until the illness becomes more severe (first-break psychosis). Symptoms of schizophrenia may be continuous or may come and go in severity. Episodes often are triggered by major life events, such as family stress, college, [employer], marriage, pregnancy or child birth, divorce, or loss of a loved one. People with schizophrenia may see, hear, or feel things that do not exist (hallucinations). They may have false beliefs in spite of obvious proof to the contrary (delusions). Sometimes speech is incoherent or behavior is odd or withdrawn.  DIAGNOSIS Schizophrenia is diagnosed through an assessment by your caregiver. Your caregiver will ask questions about your thoughts, behavior, mood, and ability to function in daily life. Your caregiver may ask questions about your medical history and use of alcohol or drugs, including prescription medication. Your caregiver may also order blood tests and imaging exams. Certain medical conditions and substances can cause symptoms that resemble schizophrenia. Your caregiver may refer you to a mental health specialist for evaluation. There are three major criterion for a diagnosis  of schizophrenia:  Two or more of the following five symptoms are present for a month or longer:  Delusions. Often the delusions are that you are being attacked, harassed, cheated, persecuted or conspired against (persecutory delusions).  Hallucinations.   Disorganized speech that does not make sense to others.  Grossly disorganized (confused or unfocused) behavior or extremely overactive or underactive motor activity (catatonia).  Negative symptoms such as bland or blunted emotions (flat affect), loss of will power (avolition), and withdrawal from social contacts (social isolation).  Level of functioning in one or more major areas of life (work, school, relationships, or self-care) is markedly below the level of functioning before the onset of illness.   There are continuous signs of illness (either mild symptoms or decreased level of functioning) for at least 6 months or longer. TREATMENT  Schizophrenia is a long-term illness. It is best controlled with continuous treatment rather than treatment only when symptoms occur. The following treatments are used to manage schizophrenia:  Medication--Medication is the most effective and important form of treatment for schizophrenia. Antipsychotic medications are usually prescribed to help manage schizophrenia. Other types of medication may be added to relieve any symptoms that may occur despite the use of antipsychotic medications.  Counseling or talk therapy--Individual, group, or family counseling may be helpful in providing education, support, and guidance. Many people with schizophrenia also benefit from social skills and job skills (vocational) training. A combination of medication and counseling is best for managing the disorder over time. A procedure in which electricity is applied to the brain through the scalp (electroconvulsive therapy) may be used to treat catatonic schizophrenia or schizophrenia in people who cannot take or do not  respond to medication and counseling. Document Released: 12/05/2000 Document Revised: 08/10/2013 Document Reviewed:  03/02/2013 Exit[employer] Patient Information 2015 [place], [place]. This information is not intended to replace advice given to you by your health care provider. Make sure you discuss any questions you have with your health care provider.

## 2015-05-12 NOTE — Progress Notes (Signed)
Staff called Mr. Juleen China to inform him that patient had been discharged and was ready to return to the group home. Mr. Juleen China informed staff he had discharged patient from his facility.  He has not informed the patient, or family.  Mr. Juleen China was informed by charge nurse that he could not discharge the patient without informing the patient and giving him a notice. Mr. Juleen China was made aware that he was going to be reported to DSS regarding this matter.  CM contacted Education officer, museum and updated information regarding patient and discharging.  Social Worker has not received return call from Mr. Juleen China.  If no contact from Mr. Juleen China, possibility of staff reporting to [facility] will be notified of situation.

## 2015-05-12 NOTE — ED Notes (Signed)
Mrs Juleen China called back and she would be here a little after 5 pm to pick up pt

## 2015-05-12 NOTE — ED Notes (Signed)
Spoke with Kyle Valenzuela [id number] regarding pt being discharged. States they wanted to do what was right for the patient. States no one had called them regarding what was going on with the patient. Stated they called over here and was told the patient was no longer here. Mrs. Kyle Valenzuela was given the number to DSS and instructed to call them

## 2015-05-12 NOTE — Progress Notes (Signed)
CM spoke with patient. Patient very calm and no problems communicating.  Patient stated his mother coming to visit from [place], [place].  CM asked if patient lived in another group home before [facility].  Patient could not remember the name of facility.

## 2015-05-12 NOTE — ED Notes (Signed)
Patient yelling for ginger ale. Ativan offered for anxiety, patient refused. Patient given ginger ale to drink.

## 2015-05-12 NOTE — ED Notes (Signed)
Spoke with Shirlean Mylar, pt's sister and POA [phone] ) states she nor her mother had been informed of pt being discharged from [facility]

## 2015-05-12 NOTE — Progress Notes (Signed)
CSW left voicemail message with patient's group home owner Mr. Juleen China, [phone], to inform him patient will be ready for discharge today.  Pscyh has assessed patient and he appears to be clear and oriented and willing to return to his group home.  CSW will follow up to assist as needed for disposition.  [facility] Disposition CSW [phone]

## 2015-05-12 NOTE — ED Notes (Signed)
Called Mr. Juleen China to inform him that patient had been discharged and was ready for someone to bring him home. Mr. Juleen China stated he had discharged patient from his facility but he has not informed the patient, family or the ED staff. States he is not going to take the patient back until he knows he is not going to continue to drink water and be mean to the other patients. States we also, have to fill out a FL2 Mr. Juleen China was informed he could not discharge the patient without informing the patient and giving him a notice. Mr. Juleen China was made aware that he was going to be reported to DSS regarding this matter.

## 2015-07-06 ENCOUNTER — Ambulatory Visit (HOSPITAL_COMMUNITY): Payer: Self-pay | Admitting: Psychiatry

## 2015-07-30 ENCOUNTER — Ambulatory Visit (HOSPITAL_COMMUNITY): Payer: Self-pay | Admitting: Psychiatry

## 2015-09-18 ENCOUNTER — Emergency Department (HOSPITAL_COMMUNITY): Payer: Medicare Other

## 2015-09-18 ENCOUNTER — Encounter (HOSPITAL_COMMUNITY): Payer: Self-pay | Admitting: Emergency Medicine

## 2015-09-18 ENCOUNTER — Inpatient Hospital Stay (HOSPITAL_COMMUNITY)
Admission: EM | Admit: 2015-09-18 | Discharge: 2015-09-21 | DRG: 064 | Disposition: A | Discharge status: Skilled Nursing Facility | Payer: Medicare Other | Attending: Internal Medicine | Admitting: Internal Medicine

## 2015-09-18 ENCOUNTER — Inpatient Hospital Stay (HOSPITAL_COMMUNITY): Payer: Medicare Other

## 2015-09-18 DIAGNOSIS — I639 Cerebral infarction, unspecified: Secondary | ICD-10-CM | POA: Diagnosis present

## 2015-09-18 DIAGNOSIS — R2681 Unsteadiness on feet: Secondary | ICD-10-CM | POA: Diagnosis not present

## 2015-09-18 DIAGNOSIS — J449 Chronic obstructive pulmonary disease, unspecified: Secondary | ICD-10-CM | POA: Diagnosis not present

## 2015-09-18 DIAGNOSIS — S0990XA Unspecified injury of head, initial encounter: Secondary | ICD-10-CM | POA: Diagnosis not present

## 2015-09-18 DIAGNOSIS — W19XXXA Unspecified fall, initial encounter: Secondary | ICD-10-CM | POA: Diagnosis present

## 2015-09-18 DIAGNOSIS — Z87891 Personal history of nicotine dependence: Secondary | ICD-10-CM | POA: Diagnosis not present

## 2015-09-18 DIAGNOSIS — N186 End stage renal disease: Secondary | ICD-10-CM | POA: Diagnosis present

## 2015-09-18 DIAGNOSIS — I614 Nontraumatic intracerebral hemorrhage in cerebellum: Secondary | ICD-10-CM

## 2015-09-18 DIAGNOSIS — R260 Ataxic gait: Secondary | ICD-10-CM | POA: Diagnosis not present

## 2015-09-18 DIAGNOSIS — F209 Schizophrenia, unspecified: Secondary | ICD-10-CM | POA: Diagnosis present

## 2015-09-18 DIAGNOSIS — E7211 Homocystinuria: Secondary | ICD-10-CM | POA: Insufficient documentation

## 2015-09-18 DIAGNOSIS — I10 Essential (primary) hypertension: Secondary | ICD-10-CM | POA: Diagnosis not present

## 2015-09-18 DIAGNOSIS — I120 Hypertensive chronic kidney disease with stage 5 chronic kidney disease or end stage renal disease: Secondary | ICD-10-CM | POA: Diagnosis present

## 2015-09-18 DIAGNOSIS — G936 Cerebral edema: Secondary | ICD-10-CM | POA: Diagnosis present

## 2015-09-18 DIAGNOSIS — R4182 Altered mental status, unspecified: Secondary | ICD-10-CM

## 2015-09-18 DIAGNOSIS — Z992 Dependence on renal dialysis: Secondary | ICD-10-CM

## 2015-09-18 DIAGNOSIS — N184 Chronic kidney disease, stage 4 (severe): Secondary | ICD-10-CM | POA: Diagnosis not present

## 2015-09-18 DIAGNOSIS — E785 Hyperlipidemia, unspecified: Secondary | ICD-10-CM | POA: Diagnosis not present

## 2015-09-18 DIAGNOSIS — Z79899 Other long term (current) drug therapy: Secondary | ICD-10-CM | POA: Diagnosis not present

## 2015-09-18 DIAGNOSIS — I6789 Other cerebrovascular disease: Secondary | ICD-10-CM | POA: Diagnosis not present

## 2015-09-18 DIAGNOSIS — F79 Unspecified intellectual disabilities: Secondary | ICD-10-CM | POA: Insufficient documentation

## 2015-09-18 LAB — CBC WITH DIFFERENTIAL/PLATELET
BASOS PCT: 0 %
Basophils Absolute: 0 10*3/uL (ref 0.0–0.1)
Eosinophils Absolute: 0.1 10*3/uL (ref 0.0–0.7)
Eosinophils Relative: 2 %
HEMATOCRIT: 34.4 % — AB (ref 39.0–52.0)
Hemoglobin: 11.2 g/dL — ABNORMAL LOW (ref 13.0–17.0)
LYMPHS PCT: 11 %
Lymphs Abs: 0.7 10*3/uL (ref 0.7–4.0)
MCH: 30.3 pg (ref 26.0–34.0)
MCHC: 32.6 g/dL (ref 30.0–36.0)
MCV: 93 fL (ref 78.0–100.0)
MONO ABS: 0.4 10*3/uL (ref 0.1–1.0)
Monocytes Relative: 6 %
NEUTROS ABS: 4.6 10*3/uL (ref 1.7–7.7)
NEUTROS PCT: 81 %
Platelets: 186 10*3/uL (ref 150–400)
RBC: 3.7 MIL/uL — ABNORMAL LOW (ref 4.22–5.81)
RDW: 12.9 % (ref 11.5–15.5)
WBC: 5.8 10*3/uL (ref 4.0–10.5)

## 2015-09-18 LAB — URINALYSIS, ROUTINE W REFLEX MICROSCOPIC
BILIRUBIN URINE: NEGATIVE
Glucose, UA: NEGATIVE mg/dL
Ketones, ur: NEGATIVE mg/dL
Leukocytes, UA: NEGATIVE
NITRITE: NEGATIVE
PH: 6.5 (ref 5.0–8.0)
Protein, ur: NEGATIVE mg/dL
UROBILINOGEN UA: 0.2 mg/dL (ref 0.0–1.0)

## 2015-09-18 LAB — CBC
HEMATOCRIT: 36.2 % — AB (ref 39.0–52.0)
HEMOGLOBIN: 12.4 g/dL — AB (ref 13.0–17.0)
MCH: 31.2 pg (ref 26.0–34.0)
MCHC: 34.3 g/dL (ref 30.0–36.0)
MCV: 91 fL (ref 78.0–100.0)
Platelets: 215 10*3/uL (ref 150–400)
RBC: 3.98 MIL/uL — AB (ref 4.22–5.81)
RDW: 12.9 % (ref 11.5–15.5)
WBC: 7.1 10*3/uL (ref 4.0–10.5)

## 2015-09-18 LAB — URINE MICROSCOPIC-ADD ON

## 2015-09-18 LAB — BASIC METABOLIC PANEL
ANION GAP: 10 (ref 5–15)
BUN: 32 mg/dL — ABNORMAL HIGH (ref 6–20)
CO2: 29 mmol/L (ref 22–32)
Calcium: 9.3 mg/dL (ref 8.9–10.3)
Chloride: 104 mmol/L (ref 101–111)
Creatinine, Ser: 4.18 mg/dL — ABNORMAL HIGH (ref 0.61–1.24)
GFR, EST AFRICAN AMERICAN: 17 mL/min — AB (ref >60)
GFR, EST NON AFRICAN AMERICAN: 15 mL/min — AB (ref >60)
GLUCOSE: 163 mg/dL — AB (ref 65–99)
POTASSIUM: 3.9 mmol/L (ref 3.5–5.1)
Sodium: 143 mmol/L (ref 135–145)

## 2015-09-18 LAB — CREATININE, SERUM
CREATININE: 4.08 mg/dL — AB (ref 0.61–1.24)
GFR, EST AFRICAN AMERICAN: 17 mL/min — AB (ref >60)
GFR, EST NON AFRICAN AMERICAN: 15 mL/min — AB (ref >60)

## 2015-09-18 LAB — MRSA PCR SCREENING: MRSA by PCR: NEGATIVE

## 2015-09-18 MED ORDER — ALPRAZOLAM 0.5 MG PO TABS
1.0000 mg | ORAL_TABLET | Freq: Four times a day (QID) | ORAL | Status: DC
  Administered 2015-09-18 – 2015-09-21 (×11): 1 mg via ORAL
  Filled 2015-09-18 (×11): qty 2

## 2015-09-18 MED ORDER — ASPIRIN 300 MG RE SUPP
300.0000 mg | Freq: Every day | RECTAL | Status: DC
  Filled 2015-09-18 (×3): qty 1

## 2015-09-18 MED ORDER — ACETAMINOPHEN 325 MG PO TABS
650.0000 mg | ORAL_TABLET | ORAL | Status: DC | PRN
  Administered 2015-09-20 – 2015-09-21 (×4): 650 mg via ORAL
  Filled 2015-09-18 (×4): qty 2

## 2015-09-18 MED ORDER — SENNOSIDES-DOCUSATE SODIUM 8.6-50 MG PO TABS: 1.0000 | ORAL_TABLET | Freq: Every evening | ORAL | Status: DC | PRN

## 2015-09-18 MED ORDER — DIVALPROEX SODIUM 500 MG PO DR TAB
500.0000 mg | DELAYED_RELEASE_TABLET | Freq: Two times a day (BID) | ORAL | Status: DC
  Administered 2015-09-18 – 2015-09-21 (×6): 500 mg via ORAL
  Filled 2015-09-18 (×8): qty 1

## 2015-09-18 MED ORDER — SODIUM BICARBONATE 650 MG PO TABS
650.0000 mg | ORAL_TABLET | Freq: Two times a day (BID) | ORAL | Status: DC
Start: 2015-09-18 — End: 2015-09-21
  Administered 2015-09-18 – 2015-09-21 (×6): 650 mg via ORAL
  Filled 2015-09-18 (×7): qty 1

## 2015-09-18 MED ORDER — ONDANSETRON HCL 4 MG/2ML IJ SOLN: 4.0000 mg | Freq: Four times a day (QID) | INTRAMUSCULAR | Status: DC | PRN

## 2015-09-18 MED ORDER — OLANZAPINE 10 MG PO TABS
10.0000 mg | ORAL_TABLET | Freq: Every day | ORAL | Status: DC
  Administered 2015-09-19 – 2015-09-21 (×3): 10 mg via ORAL
  Filled 2015-09-18 (×3): qty 1

## 2015-09-18 MED ORDER — SODIUM CHLORIDE 0.9 % IV BOLUS (SEPSIS)
500.0000 mL | Freq: Once | INTRAVENOUS | Status: AC
  Administered 2015-09-18: 500 mL via INTRAVENOUS

## 2015-09-18 MED ORDER — STROKE: EARLY STAGES OF RECOVERY BOOK
Freq: Once | Status: AC
  Administered 2015-09-18: 18:00:00
  Filled 2015-09-18: qty 1

## 2015-09-18 MED ORDER — TRAZODONE HCL 150 MG PO TABS
150.0000 mg | ORAL_TABLET | Freq: Every day | ORAL | Status: DC
  Administered 2015-09-18 – 2015-09-20 (×3): 150 mg via ORAL
  Filled 2015-09-18 (×4): qty 1

## 2015-09-18 MED ORDER — ACETAMINOPHEN 650 MG RE SUPP: 650.0000 mg | RECTAL | Status: DC | PRN

## 2015-09-18 MED ORDER — ASPIRIN 81 MG PO CHEW
324.0000 mg | CHEWABLE_TABLET | Freq: Once | ORAL | Status: AC
  Administered 2015-09-18: 324 mg via ORAL
  Filled 2015-09-18: qty 4

## 2015-09-18 MED ORDER — HEPARIN SODIUM (PORCINE) 5000 UNIT/ML IJ SOLN
5000.0000 [IU] | Freq: Three times a day (TID) | INTRAMUSCULAR | Status: DC
  Administered 2015-09-19 – 2015-09-21 (×7): 5000 [IU] via SUBCUTANEOUS
  Filled 2015-09-18 (×10): qty 1

## 2015-09-18 MED ORDER — ASPIRIN 325 MG PO TABS
325.0000 mg | ORAL_TABLET | Freq: Every day | ORAL | Status: DC
  Administered 2015-09-19 – 2015-09-21 (×3): 325 mg via ORAL
  Filled 2015-09-18 (×3): qty 1

## 2015-09-18 NOTE — Consult Note (Signed)
Neurology Consultation Reason for Consult: [place] Referring Physician: Rosalyn Gess  CC: nausea  History is obtained from:caregivers  HPI: Kyle Valenzuela is a 54 y.o. male with a history of schizophrenia and CKD who presents with n/v and difficulty walking that started Monday morning. His caregiver states that he first noticed the n/v and then noticed the difficulty walking that afternoon. He was taken for evaluation to [facility] earlier today where an MRI showed a left cerebellar infarct.   History from him is limited due to his premorbid psychiatric conditions.    LKW: 09/25 tpa given?: no, out of window    ROS: A 14 point ROS was performed and is negative except as noted in the HPI.   Past Medical History  Diagnosis Date  . Chronic kidney disease   . COPD (chronic obstructive pulmonary disease)   . Hyperglycemia   . Schizophrenia   . Hypertension   . Polydipsia      FHx: Unsure if stroke hx   Social History:  reports that he has quit smoking. His smoking use included Cigarettes. He smoked 0.50 packs per day. He has never used smokeless tobacco. He reports that he does not drink alcohol or use illicit drugs.   Exam: Current vital signs: BP 142/89 mmHg  Pulse 82  Temp(Src) 97.8 F (36.6 C) (Oral)  Resp 13  Ht 5\' 9"  (1.753 m)  Wt 72.576 kg (160 lb)  BMI 23.62 kg/m2  SpO2 100% Vital signs in last 24 hours: Temp:  [97.8 F (36.6 C)-98.3 F (36.8 C)] 97.8 F (36.6 C) (09/27 1540) Pulse Rate:  [60-82] 82 (09/27 1820) Resp:  [12-18] 13 (09/27 1820) BP: (124-151)/(79-98) 142/89 mmHg (09/27 1820) SpO2:  [99 %-100 %] 100 % (09/27 1820) Weight:  [72.576 kg (160 lb)] 72.576 kg (160 lb) (09/27 1014)   Physical Exam  Constitutional: Appears well-developed and well-nourished.  Psych: Perseverative, slightly confrontational Eyes: No scleral injection HENT: No OP obstrucion Head: Normocephalic.  Cardiovascular: Normal rate and regular rhythm.  Respiratory: Effort  normal and breath sounds normal to anterior ascultation GI: Soft.  No distension. There is no tenderness.  Skin: WDI  Neuro: Mental Status: Patient is awake, alert, oriented to person, place, month, year, and situation. Patient is able to give a clear and coherent history. No signs of aphasia or neglect. Somewhat pressured speech, perseverates on whether he will be able to eat dinner.  Cranial Nerves: II: Visual Fields are full. Pupils are equal, round, and reactive to light.   III,IV, VI: EOMI without ptosis or diploplia.  V: Facial sensation is symmetric to temperature VII: Facial movement is symmetric.  VIII: hearing is intact to voice X: Uvula elevates symmetrically XI: Shoulder shrug is symmetric. XII: tongue is midline without atrophy or fasciculations.  Motor: Tone is normal. Bulk is normal. 5/5 strength was present in all four extremities. Mild resting and postural tremor in the right arm.  Sensory: Sensation is symmetric to light touch and temperature in the arms and legs. 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 This is subtotal and I suspect that he will make it through without too much edema or compromise of the fourth ventricle, but would favor follow up CT in the AM.  He is out of the tpa window, but will need to be assessed for secondary prevention. I would prefer MRA neck, but given creatinine I am hesitant to perform either MR or CT with contrast.   Recommendations: 1. HgbA1c, fasting lipid panel 2. Frequent neuro checks 3. Echocardiogram 4. Carotid dopplers  5. Prophylactic therapy-Antiplatelet med: Aspirin - dose 325mg  PO or 300mg  PR 6. Risk factor modification 7. Telemetry monitoring 8. PT consult, OT  consult, Speech consult    Roland Rack, MD [facility] [phone]  If 7pm- 7am, please page neurology on call as listed in [place].

## 2015-09-18 NOTE — ED Provider Notes (Signed)
CSN: [id number]     Arrival date & time 09/18/15  0957 History  By signing my name below, I, Terressa Koyanagi, attest that this documentation has been prepared under the direction and in the presence of Allie Bossier, MD. Electronically Signed: Terressa Koyanagi, ED Scribe. 09/18/2015. 11:17 AM.   Chief Complaint  Patient presents with  . Vomiting  . Fall   The history is provided by the patient. No language interpreter was used.   PCP: Alonza Bogus, MD HPI Comments: Kyle Valenzuela is a 54 y.o. male, with PMHx noted below, who presents to the Emergency Department from [place] accompanied by a caregiver, complaining of an unwitnessed fall last night and witnessed fall this morning with associated head trauma and resulting bump to the forehead. Caregiver also complains of associated unsteady gait (at baseline pt ambualtes without assistance), weakness, two episodes of vomiting onset yesterday. Pt denies any present pain. Pt reports decreased intake of fluids lately. Caregiver reports addition of iron supplement is the only recent med change.   Past Medical History  Diagnosis Date  . Chronic kidney disease   . COPD (chronic obstructive pulmonary disease)   . Hyperglycemia   . Schizophrenia   . Hypertension   . Polydipsia    Past Surgical History  Procedure Laterality Date  . Tonsillectomy      as a child  . Multiple tooth extractions    . Av fistula placement  04/22/2012    Procedure: ARTERIOVENOUS (AV) FISTULA CREATION;  Surgeon: Serafina Mitchell, MD;  Location: [place];  Service: Vascular;  Laterality: Right;  . Av fistula placement  07/30/12    Right Ligation of competing branches  AVF   History reviewed. No pertinent family history. Social History  Substance Use Topics  . Smoking status: Former Smoker -- 0.50 packs/day    Types: Cigarettes  . Smokeless tobacco: Never Used     Comment: 7 cigs per day  . Alcohol Use: No    Review of Systems  HENT:       Bump on  forehead    Gastrointestinal: Positive for vomiting.  Musculoskeletal: Positive for gait problem.  Neurological: Positive for weakness.   Allergies  Review of patient's allergies indicates no known allergies.  Home Medications   Prior to Admission medications   Medication Sig Start Date End Date Taking? Authorizing Provider  albuterol (PROVENTIL HFA;VENTOLIN HFA) 108 (90 BASE) MCG/ACT inhaler Inhale 2 puffs into the lungs every 4 (four) hours as needed. For shortness of breath   Yes Historical Provider, MD  ALPRAZolam Duanne Moron) 1 MG tablet Take 1 mg by mouth 4 (four) times daily.    Yes Historical Provider, MD  amLODipine (NORVASC) 10 MG tablet Take 10 mg by mouth daily.   Yes Historical Provider, MD  carvedilol (COREG) 6.25 MG tablet Take 6.25 mg by mouth 2 (two) times daily with a meal.   Yes Historical Provider, MD  divalproex (DEPAKOTE) 500 MG DR tablet Take 500 mg by mouth 2 (two) times daily.   Yes Historical Provider, MD  iron polysaccharides (NIFEREX) 150 MG capsule Take 150 mg by mouth daily.   Yes Historical Provider, MD  OLANZapine (ZYPREXA) 10 MG tablet Take 10 mg by mouth daily.   Yes Historical Provider, MD  polyethylene glycol powder (GLYCOLAX/MIRALAX) powder Take 17 g by mouth daily.   Yes Historical Provider, MD  sodium bicarbonate 650 MG tablet Take 650 mg by mouth 2 (two) times daily.  Yes Historical Provider, MD  sodium chloride (OCEAN) 0.65 % SOLN nasal spray Place 1 spray into both nostrils as needed for congestion.   Yes Historical Provider, MD  traZODone (DESYREL) 150 MG tablet Take 150 mg by mouth at bedtime.   Yes Historical Provider, MD  zolpidem (AMBIEN) 10 MG tablet Take 10 mg by mouth at bedtime as needed for sleep.   Yes Historical Provider, MD   Triage Vitals: BP 138/80 mmHg  Pulse 77  Temp(Src) 98.3 F (36.8 C) (Oral)  Resp 18  Ht 5\' 9"  (1.753 m)  Wt 160 lb (72.576 kg)  BMI 23.62 kg/m2  SpO2 100% Physical Exam  Constitutional: He appears  well-developed and well-nourished.  HENT:  Head: Normocephalic.  Eyes: EOM are normal.  Neck: Normal range of motion.  Cardiovascular: Normal rate and regular rhythm.   Pulmonary/Chest: Effort normal and breath sounds normal.  Anterior lung fills are clear.   Abdominal: Soft. He exhibits no distension. There is no tenderness.  Musculoskeletal: Normal range of motion.       Thoracic back: He exhibits no tenderness.       Lumbar back: He exhibits no tenderness.  Neurological: He is alert. GCS eye subscore is 4. GCS verbal subscore is 4. GCS motor subscore is 6.  Equal grip strength bilateral with 5/5 strength against resistance in upper and lower extremities. No sensory or motor deficits appreciated. No obvious leg or arm drift. Staggering gait with a wide base.  Dysmetria  Psychiatric:  Repeating sentences  Nursing note and vitals reviewed.  ED Course  Procedures (including critical care time) CRITICAL CARE Performed by: Mariea Clonts   Total critical care time: 40 min  Critical care time was exclusive of separately billable procedures and treating other patients.  Critical care was necessary to treat or prevent imminent or life-threatening deterioration.  Critical care was time spent personally by me on the following activities: development of treatment plan with patient and/or surrogate as well as nursing, discussions with consultants, evaluation of patient's response to treatment, examination of patient, obtaining history from patient or surrogate, ordering and performing treatments and interventions, ordering and review of laboratory studies, ordering and review of radiographic studies, pulse oximetry and re-evaluation of patient's condition. DIAGNOSTIC STUDIES: Oxygen Saturation is 100% on ra, nl by my interpretation.    COORDINATION OF CARE: 11:16 AM: Discussed treatment plan which includes imaging, labs, and meds with pt and caregiver at bedside; they verbalize  understanding and agree with treatment plan.  Labs Review Labs Reviewed  BASIC METABOLIC PANEL - Abnormal; Notable for the following:    Glucose, Bld 163 (*)    BUN 32 (*)    Creatinine, Ser 4.18 (*)    GFR calc non Af Amer 15 (*)    GFR calc Af Amer 17 (*)    All other components within normal limits  CBC WITH DIFFERENTIAL/PLATELET - Abnormal; Notable for the following:    RBC 3.70 (*)    Hemoglobin 11.2 (*)    HCT 34.4 (*)    All other components within normal limits  URINALYSIS, ROUTINE W REFLEX MICROSCOPIC (NOT AT [facility]) - Abnormal; Notable for the following:    Color, Urine STRAW (*)    Specific Gravity, Urine <1.005 (*)    Hgb urine dipstick MODERATE (*)    All other components within normal limits  URINE MICROSCOPIC-ADD ON   Imaging Review Ct Head Wo Contrast  09/18/2015   CLINICAL DATA:  Unwitnessed fall last night. Fall  again today striking left forehead. Vomiting yesterday. Initial encounter.  EXAM: CT HEAD WITHOUT CONTRAST  TECHNIQUE: Contiguous axial images were obtained from the base of the skull through the vertex without intravenous contrast.  COMPARISON:  None.  FINDINGS: There is a large area of peripheral low-density in the posterior inferior left cerebellar hemisphere consistent with a subacute nonhemorrhagic infarct in the posterior inferior cerebellar arterial distribution. There is possible minimal patchy low-density in the contralateral cerebellum as well. No evidence of supratentorial infarct, acute hemorrhage, extra-axial fluid collection, midline shift or hydrocephalus.  The visualized paranasal sinuses, mastoid air cells and middle ears are otherwise clear. The calvarium is intact.  IMPRESSION: 1. Subacute nonhemorrhagic infarct in the left posterior inferior cerebellar arterial distribution. Possible lesser involvement in the contralateral cerebellum. 2. These results were called by telephone at the time of interpretation on 09/18/2015 at 12:19 pm to Dr. Elnora Morrison , who verbally acknowledged these results.   Electronically Signed   By: Richardean Sale M.D.   On: 09/18/2015 12:21   Mr Brain Wo Contrast  09/18/2015   CLINICAL DATA:  Confusion, vomiting, and imbalance beginning 09/17/2015. Concern for posterior circulation ischemia.  EXAM: MRI HEAD WITHOUT CONTRAST  TECHNIQUE: Multiplanar, multiecho pulse sequences of the brain and surrounding structures were obtained without intravenous contrast.  COMPARISON:  CT head 09/18/2015.  FINDINGS: There are large acute LEFT cerebellar infarcts affecting two different vascular territories. There is involvement of nearly the entire LEFT posterior inferior cerebellar artery territory, involving not only the hemisphere but the vermis and tonsil on the LEFT. There is early mass effect with left-to-right shift and effacement of the fourth ventricle. The patient is at risk for hydrocephalus.  An additional area of acute infarction affects much of the LEFT superior cerebellar artery territory, largely sparing the vermis. The lateral cerebellum, LEFT anterior inferior cerebellar artery territory, is spared. No contralateral involvement of the RIGHT cerebellum. No supratentorial involvement. No visible hemorrhage.  Flow voids are maintained in both vertebral arteries and basilar artery. In addition, the LEFT posterior inferior cerebellar artery demonstrates patent flow void. Both internal carotid arteries are widely patent.  Normal for age cerebral volume. Minor white matter disease, nonspecific. No foci of chronic hemorrhage.  No tonsillar herniation. Normal pituitary. Negative osseous structures. Extracranial soft tissues unremarkable.  IMPRESSION: Large acute LEFT cerebellar infarcts affecting the inferior and superior cerebellum on that side. These involve the LEFT PICA and SCA territories. There is early left-to-right shift and effacement of the fourth ventricle. The patient is at risk for hydrocephalus. No evidence for  hemorrhagic transformation at this time.  No visible occlusion of the vertebral arteries or basilar artery.  Findings discussed with ordering provider.   Electronically Signed   By: Staci Righter M.D.   On: 09/18/2015 14:14   I have personally reviewed and evaluated these images and lab results as part of my medical decision-making.  MDM   Final diagnoses:  Altered mental status  Cerebellar stroke, acute   Patient presents with mild confusion, vomiting and balance issues since yesterday. With history of hyponatremia concern for metabolic versus head bleed from fall versus posterior circulation stroke. Kidney function at baseline chronic kidney disease, sodium normal and hemoglobin normal. Patient having difficulty with ambulation. CT scan reviewed by myself concerning for posterior stroke, discussed results with radiologist who confirms details. MRI ordered neurology consulted.  CT scan and MRI results reviewed, discussed with radiologist concern for swelling/edema and possibly will need craniotomy if worsened. Discussed with  neurologist at [facility] who accepts patient on stepdown. Updated family. Updated hospitalist.  The patients results and plan were reviewed and discussed.   Any x-rays performed were independently reviewed by myself.   Differential diagnosis were considered with the presenting HPI.  Medications  sodium chloride 0.9 % bolus 500 mL (0 mLs Intravenous Stopped 09/18/15 1520)  aspirin chewable tablet 324 mg (324 mg Oral Given 09/18/15 1240)    Filed Vitals:   09/18/15 1436 09/18/15 1445 09/18/15 1459 09/18/15 1500  BP:    128/80  Pulse: 75 76 66 60  Temp:      TempSrc:      Resp: 12 15 13 14   Height:      Weight:      SpO2: 100% 100% 100% 100%    Final diagnoses:  Altered mental status  Cerebellar stroke, acute    Admission/ observation were discussed with the admitting physician, patient and/or family and they are comfortable with the plan.      Elnora Morrison, MD 09/18/15 [phone]

## 2015-09-18 NOTE — H&P (Signed)
[facility] History and Physical  Kyle Valenzuela [id number] DOB: Dec 07, 1961 DOA: 09/18/2015  Referring physician: Dr. Elnora Morrison, ER PCP: Alonza Bogus, MD   Chief Complaint: fall  HPI: Kyle Valenzuela is a 54 y.o. male with a history of schizophrenia, end-stage renal disease on hemodialysis who is resident of a group home, was brought to the emergency room today after he was noted to have difficulty ambulating. Patient reportedly had a fall yesterday resulting in head trauma. Patient is a difficult historian and appears somewhat unreliable in his answers. History is obtained from the medical record. Patient fell yesterday and had onset of nausea and vomiting since that time. He is also noted to have a staggering gait. He was brought to the emergency room for evaluation where CT scan of the head indicated a possible posterior circulation infarct. This was further evaluated by MRI which showed a large cerebellar infarct with edema and early left to right shift. Case was discussed with neurology on-call at [facility] who recommended transfer for further management.   Review of Systems:  Unable to accurately assess due to mental status  Past Medical History  Diagnosis Date  . Chronic kidney disease   . COPD (chronic obstructive pulmonary disease)   . Hyperglycemia   . Schizophrenia   . Hypertension   . Polydipsia    Past Surgical History  Procedure Laterality Date  . Tonsillectomy      as a child  . Multiple tooth extractions    . Av fistula placement  04/22/2012    Procedure: ARTERIOVENOUS (AV) FISTULA CREATION;  Surgeon: Serafina Mitchell, MD;  Location: [place];  Service: Vascular;  Laterality: Right;  . Av fistula placement  07/30/12    Right Ligation of competing branches  AVF   Social History:  reports that he has quit smoking. His smoking use included Cigarettes. He smoked 0.50 packs per day. He has never used smokeless tobacco. He reports that he does not drink  alcohol or use illicit drugs.  No Known Allergies  Family history: unable to assess due to mental status  Prior to Admission medications   Medication Sig Start Date End Date Taking? Authorizing Provider  albuterol (PROVENTIL HFA;VENTOLIN HFA) 108 (90 BASE) MCG/ACT inhaler Inhale 2 puffs into the lungs every 4 (four) hours as needed. For shortness of breath   Yes Historical Provider, MD  ALPRAZolam Duanne Moron) 1 MG tablet Take 1 mg by mouth 4 (four) times daily.    Yes Historical Provider, MD  amLODipine (NORVASC) 10 MG tablet Take 10 mg by mouth daily.   Yes Historical Provider, MD  carvedilol (COREG) 6.25 MG tablet Take 6.25 mg by mouth 2 (two) times daily with a meal.   Yes Historical Provider, MD  divalproex (DEPAKOTE) 500 MG DR tablet Take 500 mg by mouth 2 (two) times daily.   Yes Historical Provider, MD  iron polysaccharides (NIFEREX) 150 MG capsule Take 150 mg by mouth daily.   Yes Historical Provider, MD  OLANZapine (ZYPREXA) 10 MG tablet Take 10 mg by mouth daily.   Yes Historical Provider, MD  polyethylene glycol powder (GLYCOLAX/MIRALAX) powder Take 17 g by mouth daily.   Yes Historical Provider, MD  sodium bicarbonate 650 MG tablet Take 650 mg by mouth 2 (two) times daily.   Yes Historical Provider, MD  sodium chloride (OCEAN) 0.65 % SOLN nasal spray Place 1 spray into both nostrils as needed for congestion.   Yes Historical Provider, MD  traZODone (DESYREL) 150 MG  tablet Take 150 mg by mouth at bedtime.   Yes Historical Provider, MD  zolpidem (AMBIEN) 10 MG tablet Take 10 mg by mouth at bedtime as needed for sleep.   Yes Historical Provider, MD   Physical Exam: Filed Vitals:   09/18/15 1014 09/18/15 1021 09/18/15 1230 09/18/15 1240  BP: 138/80  134/98 134/98  Pulse: 77  72 62  Temp:  98.3 F (36.8 C)    TempSrc:  Oral    Resp: 18   15  Height: 5\' 9"  (1.753 m)     Weight: 72.576 kg (160 lb)     SpO2: 100%  100% 100%    Wt Readings from Last 3 Encounters:  09/18/15 72.576  kg (160 lb)  05/03/15 81.647 kg (180 lb)  11/23/14 81.647 kg (180 lb)    General:  Sitting up in bed, appears anxious Eyes: PERRL, normal lids, irises & conjunctiva ENT: grossly normal hearing, lips & tongue Neck: no LAD, masses or thyromegaly Cardiovascular: RRR, no m/r/g. No LE edema. Telemetry: SR, no arrhythmias  Respiratory: CTA bilaterally, no w/r/r. Normal respiratory effort. Abdomen: soft, ntnd Skin: no rash or induration seen on limited exam Musculoskeletal: grossly normal tone BUE/BLE Psychiatric: appears anxious, cooperative with exam Neurologic: strength is 5/5 bilaterally, no facial asymmetry          Labs on Admission:  Basic Metabolic Panel:  Recent Labs Lab 09/18/15 1117  NA 143  K 3.9  CL 104  CO2 29  GLUCOSE 163*  BUN 32*  CREATININE 4.18*  CALCIUM 9.3   Liver Function Tests: No results for input(s): AST, ALT, ALKPHOS, BILITOT, PROT, ALBUMIN in the last 168 hours. No results for input(s): LIPASE, AMYLASE in the last 168 hours. No results for input(s): AMMONIA in the last 168 hours. CBC:  Recent Labs Lab 09/18/15 1117  WBC 5.8  NEUTROABS 4.6  HGB 11.2*  HCT 34.4*  MCV 93.0  PLT 186   Cardiac Enzymes: No results for input(s): CKTOTAL, CKMB, CKMBINDEX, TROPONINI in the last 168 hours.  BNP (last 3 results) No results for input(s): BNP in the last 8760 hours.  ProBNP (last 3 results) No results for input(s): PROBNP in the last 8760 hours.  CBG: No results for input(s): GLUCAP in the last 168 hours.  Radiological Exams on Admission: Ct Head Wo Contrast  09/18/2015   CLINICAL DATA:  Unwitnessed fall last night. Fall again today striking left forehead. Vomiting yesterday. Initial encounter.  EXAM: CT HEAD WITHOUT CONTRAST  TECHNIQUE: Contiguous axial images were obtained from the base of the skull through the vertex without intravenous contrast.  COMPARISON:  None.  FINDINGS: There is a large area of peripheral low-density in the posterior  inferior left cerebellar hemisphere consistent with a subacute nonhemorrhagic infarct in the posterior inferior cerebellar arterial distribution. There is possible minimal patchy low-density in the contralateral cerebellum as well. No evidence of supratentorial infarct, acute hemorrhage, extra-axial fluid collection, midline shift or hydrocephalus.  The visualized paranasal sinuses, mastoid air cells and middle ears are otherwise clear. The calvarium is intact.  IMPRESSION: 1. Subacute nonhemorrhagic infarct in the left posterior inferior cerebellar arterial distribution. Possible lesser involvement in the contralateral cerebellum. 2. These results were called by telephone at the time of interpretation on 09/18/2015 at 12:19 pm to Dr. Elnora Morrison , who verbally acknowledged these results.   Electronically Signed   By: Richardean Sale M.D.   On: 09/18/2015 12:21   Mr Brain Wo Contrast  09/18/2015   CLINICAL  DATA:  Confusion, vomiting, and imbalance beginning 09/17/2015. Concern for posterior circulation ischemia.  EXAM: MRI HEAD WITHOUT CONTRAST  TECHNIQUE: Multiplanar, multiecho pulse sequences of the brain and surrounding structures were obtained without intravenous contrast.  COMPARISON:  CT head 09/18/2015.  FINDINGS: There are large acute LEFT cerebellar infarcts affecting two different vascular territories. There is involvement of nearly the entire LEFT posterior inferior cerebellar artery territory, involving not only the hemisphere but the vermis and tonsil on the LEFT. There is early mass effect with left-to-right shift and effacement of the fourth ventricle. The patient is at risk for hydrocephalus.  An additional area of acute infarction affects much of the LEFT superior cerebellar artery territory, largely sparing the vermis. The lateral cerebellum, LEFT anterior inferior cerebellar artery territory, is spared. No contralateral involvement of the RIGHT cerebellum. No supratentorial involvement. No  visible hemorrhage.  Flow voids are maintained in both vertebral arteries and basilar artery. In addition, the LEFT posterior inferior cerebellar artery demonstrates patent flow void. Both internal carotid arteries are widely patent.  Normal for age cerebral volume. Minor white matter disease, nonspecific. No foci of chronic hemorrhage.  No tonsillar herniation. Normal pituitary. Negative osseous structures. Extracranial soft tissues unremarkable.  IMPRESSION: Large acute LEFT cerebellar infarcts affecting the inferior and superior cerebellum on that side. These involve the LEFT PICA and SCA territories. There is early left-to-right shift and effacement of the fourth ventricle. The patient is at risk for hydrocephalus. No evidence for hemorrhagic transformation at this time.  No visible occlusion of the vertebral arteries or basilar artery.  Findings discussed with ordering provider.   Electronically Signed   By: Staci Righter M.D.   On: 09/18/2015 14:14    Assessment/Plan Principal Problem:   Acute CVA (cerebrovascular accident) Active Problems:   End stage renal disease   Schizophrenia, unspecified type   Fall   HTN (hypertension)   Stroke   1. Acute left cerebellar infarct. Patient has a large left cerebellar infarct with early left-to-right shift. He will need monitoring the stepdown unit. Case was discussed with Dr. Leonel Ramsay who recommended transfer to [facility] for further evaluation. Will proceed with further workup including carotid Dopplers, echocardiogram and MRA brain. Neuro checks per the stroke order set. He'll be started on aspirin. I discussed the case with Dr. Sherral Hammers who has accepted the patient transfer. 2. End-stage renal disease on hemodialysis. No indication for urgent dialysis at this time. Electrolytes are acceptable. He will need a nephrology consultation after arrival to [facility]. 3. Schizophrenia. Continue psychiatric medications. 4. Hypertension. We'll hold  antihypertensives at this time to allow for permissive hypertension. 5. Fall. Likely related to #1. Will need physical therapy consultation.  Code Status: full code DVT Prophylaxis: heparin Family Communication: no family present, unable to reach mother or sister by phone, updated group home caretaker Disposition Plan: transfer to [facility] for further management  Time spent: 93mins  Demondre Aguas [facility] Pager [phone]

## 2015-09-18 NOTE — ED Notes (Signed)
Vomited twice yesterday.  [address] this am with small bump to forehead.  Denies any pain.  From [facility].

## 2015-09-18 NOTE — ED Notes (Signed)
Employee from group home had to return to the home for approximately 45 minutes  Jeronimo Greaves [phone]

## 2015-09-18 NOTE — Progress Notes (Signed)
Received patient via [facility] into room 3S12. Floor Management, Drs.Wood and Delphi paged and made aware of pt arrival to floor.

## 2015-09-18 NOTE — ED Notes (Signed)
Chris with [facility] received report on pt and ETA of 25 min

## 2015-09-19 ENCOUNTER — Inpatient Hospital Stay (HOSPITAL_COMMUNITY): Payer: Medicare Other

## 2015-09-19 DIAGNOSIS — I6789 Other cerebrovascular disease: Secondary | ICD-10-CM

## 2015-09-19 DIAGNOSIS — R2681 Unsteadiness on feet: Secondary | ICD-10-CM

## 2015-09-19 DIAGNOSIS — I639 Cerebral infarction, unspecified: Principal | ICD-10-CM

## 2015-09-19 DIAGNOSIS — F209 Schizophrenia, unspecified: Secondary | ICD-10-CM

## 2015-09-19 DIAGNOSIS — F79 Unspecified intellectual disabilities: Secondary | ICD-10-CM | POA: Insufficient documentation

## 2015-09-19 DIAGNOSIS — N186 End stage renal disease: Secondary | ICD-10-CM

## 2015-09-19 DIAGNOSIS — E785 Hyperlipidemia, unspecified: Secondary | ICD-10-CM | POA: Insufficient documentation

## 2015-09-19 DIAGNOSIS — N184 Chronic kidney disease, stage 4 (severe): Secondary | ICD-10-CM

## 2015-09-19 DIAGNOSIS — I10 Essential (primary) hypertension: Secondary | ICD-10-CM

## 2015-09-19 LAB — C-REACTIVE PROTEIN: CRP: <0.5 mg/dL (ref <1.0)

## 2015-09-19 LAB — LIPID PANEL
CHOLESTEROL: 139 mg/dL (ref 0–200)
HDL: 40 mg/dL — AB (ref >40)
LDL Cholesterol: 78 mg/dL (ref 0–99)
TRIGLYCERIDES: 105 mg/dL (ref <150)
Total CHOL/HDL Ratio: 3.5 RATIO
VLDL: 21 mg/dL (ref 0–40)

## 2015-09-19 LAB — TSH: TSH: 0.507 u[IU]/mL (ref 0.350–4.500)

## 2015-09-19 LAB — RPR: RPR Ser Ql: NONREACTIVE

## 2015-09-19 LAB — RAPID URINE DRUG SCREEN, HOSP PERFORMED
AMPHETAMINES: NOT DETECTED
BARBITURATES: NOT DETECTED
BENZODIAZEPINES: POSITIVE — AB
COCAINE: NOT DETECTED
Opiates: NOT DETECTED
TETRAHYDROCANNABINOL: NOT DETECTED

## 2015-09-19 LAB — HIV ANTIBODY (ROUTINE TESTING W REFLEX): HIV Screen 4th Generation wRfx: NONREACTIVE

## 2015-09-19 LAB — SEDIMENTATION RATE: Sed Rate: 13 mm/hr (ref 0–16)

## 2015-09-19 MED ORDER — LORAZEPAM 2 MG/ML IJ SOLN
2.0000 mg | Freq: Once | INTRAMUSCULAR | Status: AC
  Administered 2015-09-19: 2 mg via INTRAVENOUS
  Filled 2015-09-19: qty 1

## 2015-09-19 MED ORDER — ATORVASTATIN CALCIUM 10 MG PO TABS
10.0000 mg | ORAL_TABLET | Freq: Every day | ORAL | Status: DC
  Administered 2015-09-19 – 2015-09-20 (×2): 10 mg via ORAL
  Filled 2015-09-19 (×3): qty 1

## 2015-09-19 NOTE — Care Management Note (Signed)
Case Management Note  Patient Details  Name: Kyle Valenzuela MRN: [id number] Date of Birth: November 20, 1961  Subjective/Objective:          Admitted with CVA, history of schizophrenia, end-stage renal disease on hemodialysis who is resident of a group home.       Action/Plan: Return to home when medically stable. CM to f/u with d/c needs.    Expected Discharge Date:                  Expected Discharge Plan:  Group Home ([facility] in [place])  In-House Referral:  Clinical Social Work  Discharge planning Services  CM Consult  Post Acute Care Choice:    Choice offered to:     DME Arranged:    DME Agency:     HH Arranged:    [place] Agency:     Status of Service:  In process, will continue to follow  Medicare Important Message Given:    Date Medicare IM Given:    Medicare IM give by:    Date Additional Medicare IM Given:    Additional Medicare Important Message give by:     If discussed at [place] of Stay Meetings, dates discussed:    Additional Comments: Virgel Bouquet (Other) [phone],  [name] (Sister) [phone]  Whitman Hero [place], [place] [phone] 09/19/2015, 4:03 PM

## 2015-09-19 NOTE — Progress Notes (Signed)
UR COMPLETED  

## 2015-09-19 NOTE — Progress Notes (Signed)
STROKE TEAM PROGRESS NOTE  HPI Kyle Valenzuela is a 54 y.o. male with a history of schizophrenia and CKD who presents with n/v and difficulty walking that started Monday morning. His caregiver states that he first noticed the n/v and then noticed the difficulty walking that afternoon. He was taken for evaluation to [facility] earlier today where an MRI showed a left cerebellar infarct.   History from him is limited due to his premorbid psychiatric conditions.   LKW: 09/25 tpa given?: no, out of window   SUBJECTIVE (INTERVAL HISTORY) The patient's mother was present. The patient has MR, schizophrenia and residing in a group home. He tends to repeat the same questions over and over again. He is anxious and hypervigilant but in no acute distress. He is unable to provide much in the way of history. His sister [name] is the POA. Discussed with her over the phone and she indicated less aggressive work up due to MR.    OBJECTIVE Temp:  [97.8 F (36.6 C)-98.8 F (37.1 C)] 98.8 F (37.1 C) (09/28 0430) Pulse Rate:  [60-92] 74 (09/28 0600) Cardiac Rhythm:  [-] Normal sinus rhythm (09/27 1902) Resp:  [12-18] 14 (09/28 0600) BP: (117-151)/(75-98) 150/83 mmHg (09/28 0600) SpO2:  [97 %-100 %] 100 % (09/28 0600) Weight:  [72.576 kg (160 lb)] 72.576 kg (160 lb) (09/27 1014)  CBC:  Recent Labs Lab 09/18/15 1117 09/18/15 1720  WBC 5.8 7.1  NEUTROABS 4.6  --   HGB 11.2* 12.4*  HCT 34.4* 36.2*  MCV 93.0 91.0  PLT 186 [id number]    Basic Metabolic Panel:  Recent Labs Lab 09/18/15 1117 09/18/15 1720  NA 143  --   K 3.9  --   CL 104  --   CO2 29  --   GLUCOSE 163*  --   BUN 32*  --   CREATININE 4.18* 4.08*  CALCIUM 9.3  --     Lipid Panel:    Component Value Date/Time   CHOL 139 09/19/2015 0250   TRIG 105 09/19/2015 0250   HDL 40* 09/19/2015 0250   CHOLHDL 3.5 09/19/2015 0250   VLDL 21 09/19/2015 0250   LDLCALC 78 09/19/2015 0250   HgbA1c: No results found for:  HGBA1C Urine Drug Screen:    Component Value Date/Time   LABOPIA NONE DETECTED 05/03/2015 1607   COCAINSCRNUR NONE DETECTED 05/03/2015 1607   LABBENZ POSITIVE* 05/03/2015 1607   AMPHETMU NONE DETECTED 05/03/2015 1607   THCU NONE DETECTED 05/03/2015 1607   LABBARB NONE DETECTED 05/03/2015 1607      IMAGING  I have personally reviewed the radiological images below and agree with the radiology interpretations.  Dg Chest 2 View 09/18/2015    No active cardiopulmonary disease.     Ct Head Wo Contrast 09/18/2015    1. Subacute nonhemorrhagic infarct in the left posterior inferior cerebellar arterial distribution. Possible lesser involvement in the contralateral cerebellum.   Mr Brain Wo Contrast 09/18/2015    Large acute LEFT cerebellar infarcts affecting the inferior and superior cerebellum on that side. These involve the LEFT PICA and SCA territories. There is early left-to-right shift and effacement of the fourth ventricle. The patient is at risk for hydrocephalus. No evidence for hemorrhagic transformation at this time.  No visible occlusion of the vertebral arteries or basilar artery.   MRA pending  2D echo - - Left ventricle: The cavity size was normal. Systolic function was normal. The estimated ejection fraction was in the range of  55% to 60%. Although no diagnostic regional wall motion abnormality was identified, this possibility cannot be completely excluded on the basis of this study. Left ventricular diastolic function parameters were probably normal.  2D echo pending  LE venous doppler pending   PHYSICAL EXAM  Temp:  [97.8 F (36.6 C)-98.8 F (37.1 C)] 97.9 F (36.6 C) (09/28 0801) Pulse Rate:  [60-94] 94 (09/28 0840) Resp:  [12-18] 18 (09/28 0840) BP: (117-151)/(75-91) 126/87 mmHg (09/28 0840) SpO2:  [97 %-100 %] 98 % (09/28 0840)  General - Well nourished, well developed, in no apparent distress.  Ophthalmologic - Fundi not visualized due to  noncooperation  Cardiovascular - Regular rate and rhythm with no murmur.  Mental Status -  Level of arousal and orientation to time, place, and person were intact. Language including expression, naming, repetition, comprehension was assessed and found intact. Fund of Knowledge was assessed and was impaired.  As per mom, pt has MR and his cognitive capacity is at level of age 68-15.                                                                                                  .  Cranial Nerves II - XII - II - Visual field intact OU. III, IV, VI - Extraocular movements intact. V - Facial sensation intact bilaterally. VII - Facial movement intact bilaterally. VIII - Hearing & vestibular intact bilaterally. X - Palate elevates symmetrically. XI - Chin turning & shoulder shrug intact bilaterally. XII - Tongue protrusion intact.  Motor Strength - The patient's strength was normal in all extremities and pronator drift was absent.  Bulk was normal and fasciculations were absent.   Motor Tone - Muscle tone was assessed at the neck and appendages and was normal.  Reflexes - The patient's reflexes were 1+ in all extremities and he had no pathological reflexes.  Sensory - Light touch, temperature/pinprick were assessed and were symmetrical.    Coordination - The patient had left UE ataxia, LLE not able to test due to noncooperation. He has right hand tremor which has been for long time as per mom.   Gait and Station - not tested due to safety concerns.   ASSESSMENT/PLAN Mr. Kyle Valenzuela is a 54 y.o. male with history of CKD (refused for dialysis), MR, COPD, HTN, and schizophrenia presenting with difficulty walking, nausea and vomiting.  He did not receive IV t-PA due to late presentation.  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   Resultant  Left UE ataxia  MRI  Large acute LEFT cerebellar infarcts affecting the PICA and SCA terrotories  MRA   pending  Carotid Doppler - pending  Lower extremity venous duplex - pending  2D Echo unremarkable  Vasculitis and hypercoagulable labs - pending  LDL - 78  HgbA1c pending  VTE prophylaxis - subq heparin  Diet renal with fluid restriction Fluid restriction:: 1200 mL Fluid; Room service appropriate?: Yes; Fluid consistency:: Thin  no antithrombotic prior to admission, now on aspirin 325 mg orally every day.   Discussed with sister also the POA,  no aggressive work up such as TEE or loop recorder. We recommend 30 day cardiac event monitor as outpt. Please set up with cardiology or cardiologist of his choice. POA agrees with the plan.  Patient counseled to be compliant with his antithrombotic medications  Ongoing aggressive stroke risk factor management  Hypertension  Stable  Permissive hypertension (OK if < 220/120) but gradually normalize in 5-7 days  Hyperlipidemia  Home meds:  No lipid lowering medications prior to admission  LDL 78, goal < 70  Add Lipitor 10 mg daily  Continue statin at discharge  Other Stroke Risk Factors  Cigarette smoker, quit smoking   Other Active Problems  Mild anemia  MR - level of age 36-15  Schizophrenia - on depakote  CKD - refused HD so far  Hospital day # 1  Rosalin Hawking, MD PhD Stroke Neurology 09/19/2015 1:58 PM    To contact Stroke Continuity provider, please refer to [email or web address] After hours, contact General Neurology

## 2015-09-19 NOTE — Evaluation (Signed)
Speech Language Pathology Evaluation Patient Details Name: Kyle Valenzuela MRN: [id number] DOB: 31-Aug-1961 Today's Date: 09/19/2015 Time: [id number] SLP Time Calculation (min) (ACUTE ONLY): 10 min  Problem List:  Patient Active Problem List   Diagnosis Date Noted  . Acute CVA (cerebrovascular accident) 09/18/2015  . Fall 09/18/2015  . HTN (hypertension) 09/18/2015  . Stroke 09/18/2015  . 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 Psychogenic polydipsia   . Schizophrenia, unspecified type   . End stage renal disease 03/01/2012   Past Medical History:  Past Medical History  Diagnosis Date  . Chronic kidney disease   . COPD (chronic obstructive pulmonary disease)   . Hyperglycemia   . Schizophrenia   . Hypertension   . Polydipsia    Past Surgical History:  Past Surgical History  Procedure Laterality Date  . Tonsillectomy      as a child  . Multiple tooth extractions    . Av fistula placement  04/22/2012    Procedure: ARTERIOVENOUS (AV) FISTULA CREATION;  Surgeon: Serafina Mitchell, MD;  Location: [place];  Service: Vascular;  Laterality: Right;  . Av fistula placement  07/30/12    Right Ligation of competing branches  AVF   HPI:  Kyle Valenzuela is a 54 y.o. male with a history of schizophrenia and CKD who presents with n/v and difficulty walking that started Monday morning. His caregiver states that he first noticed the n/v and then noticed the difficulty walking that afternoon. He was taken for evaluation to [place] where an MRI showed a left cerebellar infarct.   Assessment / Plan / Recommendation Clinical Impression  Pt presented with cognitive deficits consistent with baseline psych diagnosis. Pt demonstrated adequate memory, language, and speech. Pt had perseverative ideas which interferred with assessment tasks. SLP provided repetition and redirected the pt to stay on task. Pt reported he used to live at a group home, his sister will be visiting today,  and he lives with family. SLP expects pt is at baseline function and will be able to return to previous living situation. No SLP f/u needed.     SLP Assessment  Patient does not need any further Speech Lanaguage Pathology Services    Follow Up Recommendations       Frequency and Duration        Pertinent Vitals/Pain Pain Assessment: Faces Faces Pain Scale: No hurt   SLP Goals     SLP Evaluation Prior Functioning  Cognitive/Linguistic Baseline: Baseline deficits Baseline deficit details: psych history Type of Home:  (pt indicates group home, unreliable)   Cognition  Overall Cognitive Status: History of cognitive impairments - at baseline Arousal/Alertness: Awake/alert Orientation Level: Oriented X4 Attention: Sustained Sustained Attention: Impaired Sustained Attention Impairment: Verbal basic Memory: Appears intact Awareness: Appears intact Problem Solving: Appears intact Executive Function: Self Monitoring Self Monitoring: Impaired Self Monitoring Impairment: Verbal complex (perseverative) Behaviors: Perseveration Safety/Judgment: Appears intact    Comprehension  Auditory Comprehension Overall Auditory Comprehension: Appears within functional limits for tasks assessed Yes/No Questions: Within Functional Limits Commands: Not tested Conversation: Simple Interfering Components: Other (comment) (prior psych diagnosis) EffectiveTechniques: Repetition Visual Recognition/Discrimination Discrimination: Not tested Reading Comprehension Reading Status: Not tested    Expression Expression Primary Mode of Expression: Verbal Verbal Expression Overall Verbal Expression: Appears within functional limits for tasks assessed Initiation: No impairment Repetition: No impairment Naming: Not tested Pragmatics: Impairment Impairments: Topic maintenance;Eye contact Interfering Components: Premorbid deficit Written Expression Written Expression: Not tested   Oral / Motor Motor  Speech Overall Motor Speech: Appears within functional limits for tasks assessed   [place], Student-SLP  Lanier Ensign 09/19/2015, 9:52 AM

## 2015-09-19 NOTE — Progress Notes (Signed)
  Echocardiogram 2D Echocardiogram has been performed.  Kyle Valenzuela M 09/19/2015, 8:45 AM

## 2015-09-19 NOTE — Progress Notes (Signed)
PROGRESS NOTE  BUKHARI SERVEDIO [id number] DOB: [birth year] DOA: 09/18/2015 PCP: Alonza Bogus, MD Brief History 54 y.o. male with a history of schizophrenia, end-stage renal disease on hemodialysis who is resident of a group home, was brought to the emergency room today after he was noted to have difficulty ambulating. Patient reportedly had a fall yesterday resulting in head trauma. Patient is a difficult historian and appears somewhat unreliable in his answers.  After noting staggering gait, CT brain in ED suggested left posterior cerebellar infarct.  MRI of brain showed  large L-cerebellar infarcts with Left to right shift. Assessment/Plan: Acute Left cerebellar stroke -09/18/15 MR brain--large left cerebellar infarcts affecting inferior and superior cerebellum with early left-to-right shift and effacement of the left ventricle -appreciate neurology follow up -Hemoglobin A1c -LDL 78 -MRA brain  -TSH 0.507 -Echocardiogram--pending -Continue aspirin -PT/OT/ST CKD stage IV -Baseline creatinine 4.1-4.4 -follows Dr. Hinda Lenis in [place] -family does not want HD -Continue bicarbonate Hypertension -Restart amlodipine and carvedilol Schizophrenia -Continue Depakote, trazodone, Zyprexa Dyslipidemia -start atorvastatin   Family Communication:   Mother updated at bedside Disposition Plan:   Home when medically stable       Procedures/Studies: Dg Chest 2 View  09/18/2015   CLINICAL DATA:  Stroke  EXAM: CHEST  2 VIEW  COMPARISON:  04/20/2014  FINDINGS: The heart size and mediastinal contours are within normal limits. Both lungs are clear. The visualized skeletal structures are unremarkable.  IMPRESSION: No active cardiopulmonary disease.   Electronically Signed   By: Kathreen Devoid   On: 09/18/2015 21:10   Ct Head Wo Contrast  09/18/2015   CLINICAL DATA:  Unwitnessed fall last night. Fall again today striking left forehead. Vomiting yesterday. Initial encounter.   EXAM: CT HEAD WITHOUT CONTRAST  TECHNIQUE: Contiguous axial images were obtained from the base of the skull through the vertex without intravenous contrast.  COMPARISON:  None.  FINDINGS: There is a large area of peripheral low-density in the posterior inferior left cerebellar hemisphere consistent with a subacute nonhemorrhagic infarct in the posterior inferior cerebellar arterial distribution. There is possible minimal patchy low-density in the contralateral cerebellum as well. No evidence of supratentorial infarct, acute hemorrhage, extra-axial fluid collection, midline shift or hydrocephalus.  The visualized paranasal sinuses, mastoid air cells and middle ears are otherwise clear. The calvarium is intact.  IMPRESSION: 1. Subacute nonhemorrhagic infarct in the left posterior inferior cerebellar arterial distribution. Possible lesser involvement in the contralateral cerebellum. 2. These results were called by telephone at the time of interpretation on 09/18/2015 at 12:19 pm to Dr. Elnora Morrison , who verbally acknowledged these results.   Electronically Signed   By: Richardean Sale M.D.   On: 09/18/2015 12:21   Mr Brain Wo Contrast  09/18/2015   CLINICAL DATA:  Confusion, vomiting, and imbalance beginning 09/17/2015. Concern for posterior circulation ischemia.  EXAM: MRI HEAD WITHOUT CONTRAST  TECHNIQUE: Multiplanar, multiecho pulse sequences of the brain and surrounding structures were obtained without intravenous contrast.  COMPARISON:  CT head 09/18/2015.  FINDINGS: There are large acute LEFT cerebellar infarcts affecting two different vascular territories. There is involvement of nearly the entire LEFT posterior inferior cerebellar artery territory, involving not only the hemisphere but the vermis and tonsil on the LEFT. There is early mass effect with left-to-right shift and effacement of the fourth ventricle. The patient is at risk for hydrocephalus.  An additional area of acute infarction affects much of  the LEFT superior cerebellar artery  territory, largely sparing the vermis. The lateral cerebellum, LEFT anterior inferior cerebellar artery territory, is spared. No contralateral involvement of the RIGHT cerebellum. No supratentorial involvement. No visible hemorrhage.  Flow voids are maintained in both vertebral arteries and basilar artery. In addition, the LEFT posterior inferior cerebellar artery demonstrates patent flow void. Both internal carotid arteries are widely patent.  Normal for age cerebral volume. Minor white matter disease, nonspecific. No foci of chronic hemorrhage.  No tonsillar herniation. Normal pituitary. Negative osseous structures. Extracranial soft tissues unremarkable.  IMPRESSION: Large acute LEFT cerebellar infarcts affecting the inferior and superior cerebellum on that side. These involve the LEFT PICA and SCA territories. There is early left-to-right shift and effacement of the fourth ventricle. The patient is at risk for hydrocephalus. No evidence for hemorrhagic transformation at this time.  No visible occlusion of the vertebral arteries or basilar artery.  Findings discussed with ordering provider.   Electronically Signed   By: Staci Righter M.D.   On: 09/18/2015 14:14         Subjective: Patient denies fevers, chills, headache, chest pain, dyspnea, nausea, vomiting, diarrhea, abdominal pain, dysuria, hematuria   Objective: Filed Vitals:   09/19/15 0230 09/19/15 0430 09/19/15 0600 09/19/15 0801  BP: 120/82 129/82 150/83   Pulse: 92 87 74   Temp: 98.8 F (37.1 C) 98.8 F (37.1 C)  97.9 F (36.6 C)  TempSrc: Oral Oral  Oral  Resp: 15 15 14    Height:      Weight:      SpO2: 98% 98% 100%     Intake/Output Summary (Last 24 hours) at 09/19/15 1202 Last data filed at 09/19/15 0800  Gross per 24 hour  Intake   1010 ml  Output   3100 ml  Net  -2090 ml   Weight change:  Exam:   General:  Pt is alert, follows commands appropriately, not in acute  distress  HEENT: No icterus, No thrush, No neck mass, [place]/AT  Cardiovascular: RRR, S1/S2, no rubs, no gallops  Respiratory: CTA bilaterally, no wheezing, no crackles, no rhonchi  Abdomen: Soft/+BS, non tender, non distended, no guarding; no hepatosplenomegaly  Extremities: No edema, No lymphangitis, No petechiae, No rashes, no synovitis; no cyanosis or clubbing  Data Reviewed: Basic Metabolic Panel:  Recent Labs Lab 09/18/15 1117 09/18/15 1720  NA 143  --   K 3.9  --   CL 104  --   CO2 29  --   GLUCOSE 163*  --   BUN 32*  --   CREATININE 4.18* 4.08*  CALCIUM 9.3  --    Liver Function Tests: No results for input(s): AST, ALT, ALKPHOS, BILITOT, PROT, ALBUMIN in the last 168 hours. No results for input(s): LIPASE, AMYLASE in the last 168 hours. No results for input(s): AMMONIA in the last 168 hours. CBC:  Recent Labs Lab 09/18/15 1117 09/18/15 1720  WBC 5.8 7.1  NEUTROABS 4.6  --   HGB 11.2* 12.4*  HCT 34.4* 36.2*  MCV 93.0 91.0  PLT 186 215   Cardiac Enzymes: No results for input(s): CKTOTAL, CKMB, CKMBINDEX, TROPONINI in the last 168 hours. BNP: Invalid input(s): POCBNP CBG: No results for input(s): GLUCAP in the last 168 hours.  Recent Results (from the past 240 hour(s))  MRSA PCR Screening     Status: None   Collection Time: 09/18/15  5:01 PM  Result Value Ref Range Status   MRSA by PCR NEGATIVE NEGATIVE Final    Comment:  The GeneXpert MRSA Assay (FDA approved for NASAL specimens only), is one component of a comprehensive MRSA colonization surveillance program. It is not intended to diagnose MRSA infection nor to guide or monitor treatment for MRSA infections.      Scheduled Meds: . ALPRAZolam  1 mg Oral QID  . aspirin  300 mg Rectal Daily   Or  . aspirin  325 mg Oral Daily  . divalproex  500 mg Oral BID  . heparin  5,000 Units Subcutaneous 3 times per day  . LORazepam  2 mg Intravenous Once  . OLANZapine  10 mg Oral Daily  .  sodium bicarbonate  650 mg Oral BID  . traZODone  150 mg Oral QHS   Continuous Infusions:    TAT, DAVID, DO  [facility] Pager [phone]  If 7PM-7AM, please contact night-coverage [email or web address] Password [email or web address] 09/19/2015, 12:02 PM   LOS: 1 day

## 2015-09-19 NOTE — Evaluation (Addendum)
Physical Therapy Evaluation Patient Details Name: Kyle Valenzuela MRN: [id number] DOB: 05-23-1961 Today's Date: 09/19/2015   History of Present Illness  This 54 y.o. Male with h/o schizophrenia and ESRD admitted with n/v and difficulty walking.  MRI showed Lt cerebellar infarct.    Clinical Impression  Pt admitted with/for s/s of stroke. MRI showed L cerebellar infarct..  Pt currently limited functionally due to the problems listed below.  (see problems list.)  Pt will benefit from PT to maximize function and safety to be able to get home safely with available assist of group home staff. .     Follow Up Recommendations Home health PT;Other (comment);Supervision for mobility/OOB (to group home if home able to manage him, otherwise will require SNF)    Equipment Recommendations       Recommendations for Other Services       Precautions / Restrictions Precautions Precautions: Fall      Mobility  Bed Mobility Overal bed mobility: Needs Assistance Bed Mobility: Supine to Sit;Sit to Supine     Supine to sit: Supervision Sit to supine: Supervision   General bed mobility comments: supervision for safety   Transfers Overall transfer level: Needs assistance   Transfers: Sit to/from Stand Sit to Stand: Min assist Stand pivot transfers: Min assist       General transfer comment: Moves slowly.  Ataxia noted   Ambulation/Gait Ambulation/Gait assistance: Min assist Ambulation Distance (Feet): 150 Feet Assistive device: 1 person hand held assist Gait Pattern/deviations: Step-through pattern;Decreased step length - right;Decreased step length - left;Ataxic Gait velocity: slower, pt getting out of control when tries to speed up. Gait velocity interpretation: Below normal speed for age/gender General Gait Details: short choppy, mildly ataxic steps with arms held in guard position  Stairs            Wheelchair Mobility    Modified Rankin (Stroke Patients  Only) Modified Rankin (Stroke Patients Only) Pre-Morbid Rankin Score: No significant disability Modified Rankin: Moderately severe disability     Balance Overall balance assessment: Needs assistance Sitting-balance support: Feet supported Sitting balance-Leahy Scale: Good     Standing balance support: Single extremity supported Standing balance-Leahy Scale: Poor                               Pertinent Vitals/Pain Pain Assessment: No/denies pain Faces Pain Scale: No hurt    Home Living Family/patient expects to be discharged to:: Group home                 Additional Comments: pt stated he had help for bathing, but could eat and dress himself and didn't use a walker    Prior Function Level of Independence: Independent         Comments: Pt indicates he bathed and dressed independently and did not use AD for ambulation.  He states he fell before coming to the hospital, and that this was his first fall      Hand Dominance   Dominant Hand: Right    Extremity/Trunk Assessment   Upper Extremity Assessment: Defer to OT evaluation RUE Deficits / Details: Pt moves slowly and deliberately - Questionable mild dysmetria      LUE Deficits / Details: dysmetria noted    Lower Extremity Assessment: Generalized weakness;LLE deficits/detail   LLE Deficits / Details: difficult to MMT, but notable ataxia with any effort.  Cervical / Trunk Assessment: Normal  Communication   Communication: Other (comment) (low  volume )  Cognition Arousal/Alertness: Awake/alert Behavior During Therapy: Restless Overall Cognitive Status: History of cognitive impairments - at baseline                      General Comments General comments (skin integrity, edema, etc.): vss    Exercises        Assessment/Plan    PT Assessment Patient needs continued PT services  PT Diagnosis Abnormality of gait   PT Problem List Decreased strength;Decreased activity  tolerance;Decreased balance;Decreased mobility;Decreased safety awareness  PT Treatment Interventions Gait training;Functional mobility training;Therapeutic activities;Balance training;Patient/family education;Neuromuscular re-education   PT Goals (Current goals can be found in the Care Plan section) Acute Rehab PT Goals Patient Stated Goal: Pt did not state  PT Goal Formulation: Patient unable to participate in goal setting Time For Goal Achievement: 09/26/15 Potential to Achieve Goals: Fair    Frequency Min 3X/week   Barriers to discharge        Co-evaluation               End of Session   Activity Tolerance: Patient tolerated treatment well Patient left: in bed;with call bell/phone within reach Nurse Communication: Mobility status         Time: [id number] PT Time Calculation (min) (ACUTE ONLY): 20 min   Charges:   PT Evaluation $Initial PT Evaluation Tier I: 1 Procedure     PT G Codes:        Mottinger, Tessie Fass 09/19/2015, 6:37 PM 09/19/2015  Donnella Sham, PT [phone]  (pager)

## 2015-09-19 NOTE — Evaluation (Signed)
Occupational Therapy Evaluation Patient Details Name: Kyle Valenzuela MRN: [id number] DOB: March 27, 1961 Today's Date: 09/19/2015    History of Present Illness This 54 y.o. Male with h/o schizophrenia and ESRD admitted with n/v and difficulty walking.  MRI showed Lt cerebellar infarct.     Clinical Impression   Pt admitted with above. He demonstrates the below listed deficits and will benefit from continued OT to maximize safety and independence with BADLs.  Pt currently requires min A for ADLs.  Pt noted with ataxia.   He will require 24 hour assist at discharge.  IF group home is able to provide needed level of assist, recommend return to group home with [facility], however, if they are unable to provide needed level of assist, then he will require SNF level rehab before returning to group home.       Follow Up Recommendations  Home health OT;Supervision/Assistance - 24 hour if group home able to provide min A level care.  If they are not, he will require SNF.    Equipment Recommendations  Tub/shower bench    Recommendations for Other Services       Precautions / Restrictions Precautions Precautions: Fall      Mobility Bed Mobility Overal bed mobility: Needs Assistance Bed Mobility: Supine to Sit;Sit to Supine     Supine to sit: Supervision Sit to supine: Supervision   General bed mobility comments: supervision for safety   Transfers Overall transfer level: Needs assistance   Transfers: Sit to/from Stand;Stand Pivot Transfers Sit to Stand: Min assist Stand pivot transfers: Min assist       General transfer comment: Moves slowly.  Ataxia noted     Balance Overall balance assessment: Needs assistance Sitting-balance support: Feet supported Sitting balance-Leahy Scale: Good     Standing balance support: Single extremity supported Standing balance-Leahy Scale: Poor                              ADL Overall ADL's : Needs  assistance/impaired Eating/Feeding: Modified independent;Sitting   Grooming: Applying deodorant;Wash/dry face;Wash/dry hands;Standing;Minimal assistance   Upper Body Bathing: Set up;Supervision/ safety;Sitting   Lower Body Bathing: Minimal assistance;Sit to/from stand   Upper Body Dressing : Minimal assistance;Sitting   Lower Body Dressing: Minimal assistance;Sit to/from stand   Toilet Transfer: Minimal assistance;Stand-pivot;BSC   Toileting- Clothing Manipulation and Hygiene: Minimal assistance;Sit to/from stand       Functional mobility during ADLs: Minimal assistance General ADL Comments: Pt requires assist for balance      Vision Additional Comments: Pt denies blurry vision, diplopia, visual changes, or dizziness.  He was not able to participate in formal visual assessment due to distractability.  He demonstrates full EOMs, and was able to read the clock without difficulty    Perception Perception Perception Tested?: Yes   Praxis Praxis Praxis tested?: Within functional limits    Pertinent Vitals/Pain Pain Assessment: No/denies pain     Hand Dominance Right   Extremity/Trunk Assessment Upper Extremity Assessment Upper Extremity Assessment: RUE deficits/detail;LUE deficits/detail RUE Deficits / Details: Pt moves slowly and deliberately - Questionable mild dysmetria  RUE Coordination: decreased gross motor LUE Deficits / Details: dysmetria noted  LUE Coordination: decreased fine motor;decreased gross motor   Lower Extremity Assessment Lower Extremity Assessment: Defer to PT evaluation   Cervical / Trunk Assessment Cervical / Trunk Assessment: Normal   Communication Communication Communication: Other (comment) (low volume )   Cognition     Overall Cognitive Status:  History of cognitive impairments - at baseline                     General Comments       Exercises       Shoulder Instructions      Home Living Family/patient expects to be  discharged to:: Group home                                 Additional Comments: Pt unable to provide information re: specifics of group home       Prior Functioning/Environment Level of Independence: Independent        Comments: Pt indicates he bathed and dressed independently and did not use AD for ambulation.  He states he fell before coming to the hospital, and that this was his first fall     OT Diagnosis: Generalized weakness;Cognitive deficits;Disturbance of vision;Ataxia   OT Problem List: Decreased activity tolerance;Impaired balance (sitting and/or standing);Impaired vision/perception;Decreased coordination;Decreased safety awareness;Decreased knowledge of use of DME or AE;Impaired UE functional use   OT Treatment/Interventions: Self-care/ADL training;Neuromuscular education;DME and/or AE instruction;Therapeutic activities;Patient/family education;Balance training;Visual/perceptual remediation/compensation    OT Goals(Current goals can be found in the care plan section) Acute Rehab OT Goals Patient Stated Goal: Pt did not state  OT Goal Formulation: With patient Time For Goal Achievement: 10/03/15 Potential to Achieve Goals: Good ADL Goals Pt Will Perform Grooming: with supervision;standing Pt Will Perform Lower Body Bathing: with supervision;sit to/from stand Pt Will Perform Upper Body Dressing: with supervision;sitting;standing Pt Will Perform Lower Body Dressing: with supervision;sit to/from stand Pt Will Transfer to Toilet: with supervision;ambulating;bedside commode;grab bars;regular height toilet Pt Will Perform Toileting - Clothing Manipulation and hygiene: with supervision;sit to/from stand  OT Frequency: Min 2X/week   Barriers to D/C: Decreased caregiver support  Unsure how much assist his group home is able to provide        Co-evaluation              End of Session Nurse Communication: Mobility status  Activity Tolerance: Patient  tolerated treatment well Patient left: in bed;with call bell/phone within reach;with nursing/sitter in room   Time: [id number] OT Time Calculation (min): 12 min Charges:  OT General Charges $OT Visit: 1 Procedure OT Evaluation $Initial OT Evaluation Tier I: 1 Procedure G-Codes:    Lucille Passy M 09/29/15, 4:41 PM

## 2015-09-20 ENCOUNTER — Inpatient Hospital Stay (HOSPITAL_COMMUNITY): Payer: Medicare Other

## 2015-09-20 DIAGNOSIS — I639 Cerebral infarction, unspecified: Secondary | ICD-10-CM

## 2015-09-20 LAB — HEMOGLOBIN A1C
HEMOGLOBIN A1C: 5.1 % (ref 4.8–5.6)
MEAN PLASMA GLUCOSE: 100 mg/dL

## 2015-09-20 LAB — SJOGRENS SYNDROME-A EXTRACTABLE NUCLEAR ANTIBODY: SSA (Ro) (ENA) Antibody, IgG: 1.6 AI — ABNORMAL HIGH (ref 0.0–0.9)

## 2015-09-20 LAB — CBC
HCT: 35.7 % — ABNORMAL LOW (ref 39.0–52.0)
HEMOGLOBIN: 11.7 g/dL — AB (ref 13.0–17.0)
MCH: 30.1 pg (ref 26.0–34.0)
MCHC: 32.8 g/dL (ref 30.0–36.0)
MCV: 91.8 fL (ref 78.0–100.0)
Platelets: 202 10*3/uL (ref 150–400)
RBC: 3.89 MIL/uL — ABNORMAL LOW (ref 4.22–5.81)
RDW: 12.8 % (ref 11.5–15.5)
WBC: 8.1 10*3/uL (ref 4.0–10.5)

## 2015-09-20 LAB — BASIC METABOLIC PANEL
ANION GAP: 11 (ref 5–15)
BUN: 26 mg/dL — AB (ref 6–20)
CALCIUM: 9.6 mg/dL (ref 8.9–10.3)
CO2: 27 mmol/L (ref 22–32)
Chloride: 102 mmol/L (ref 101–111)
Creatinine, Ser: 4.1 mg/dL — ABNORMAL HIGH (ref 0.61–1.24)
GFR calc Af Amer: 17 mL/min — ABNORMAL LOW (ref >60)
GFR, EST NON AFRICAN AMERICAN: 15 mL/min — AB (ref >60)
GLUCOSE: 109 mg/dL — AB (ref 65–99)
Potassium: 4 mmol/L (ref 3.5–5.1)
Sodium: 140 mmol/L (ref 135–145)

## 2015-09-20 LAB — CARDIOLIPIN ANTIBODIES, IGG, IGM, IGA: Anticardiolipin IgA: <9 APL U/mL (ref 0–11)

## 2015-09-20 LAB — HOMOCYSTEINE: HOMOCYSTEINE-NORM: 25.6 umol/L — AB (ref 0.0–15.0)

## 2015-09-20 LAB — C3 COMPLEMENT: C3 COMPLEMENT: 124 mg/dL (ref 82–167)

## 2015-09-20 LAB — ANCA TITERS
Atypical P-ANCA titer: <1:20 {titer}
C-ANCA: <1:20 {titer}

## 2015-09-20 LAB — RHEUMATOID FACTOR: Rhuematoid fact SerPl-aCnc: <10 IU/mL (ref 0.0–13.9)

## 2015-09-20 LAB — ANTI-DNA ANTIBODY, DOUBLE-STRANDED: ds DNA Ab: <1 IU/mL (ref 0–9)

## 2015-09-20 LAB — C4 COMPLEMENT: Complement C4, Body Fluid: 37 mg/dL (ref 14–44)

## 2015-09-20 LAB — SJOGRENS SYNDROME-B EXTRACTABLE NUCLEAR ANTIBODY

## 2015-09-20 MED ORDER — CARVEDILOL 6.25 MG PO TABS
6.2500 mg | ORAL_TABLET | Freq: Two times a day (BID) | ORAL | Status: DC
  Administered 2015-09-20 – 2015-09-21 (×2): 6.25 mg via ORAL
  Filled 2015-09-20 (×6): qty 1

## 2015-09-20 MED ORDER — PNEUMOCOCCAL VAC POLYVALENT 25 MCG/0.5ML IJ INJ
0.5000 mL | INJECTION | INTRAMUSCULAR | Status: DC
  Filled 2015-09-20: qty 0.5

## 2015-09-20 MED ORDER — INFLUENZA VAC SPLIT QUAD 0.5 ML IM SUSY
0.5000 mL | PREFILLED_SYRINGE | INTRAMUSCULAR | Status: DC
  Filled 2015-09-20: qty 0.5

## 2015-09-20 NOTE — Progress Notes (Signed)
STROKE TEAM PROGRESS NOTE  HPI Kyle Valenzuela is a 54 y.o. male with a history of schizophrenia and CKD who presents with n/v and difficulty walking that started Monday morning. His caregiver states that he first noticed the n/v and then noticed the difficulty walking that afternoon. He was taken for evaluation to [facility] earlier today where an MRI showed a left cerebellar infarct.   History from him is limited due to his premorbid psychiatric conditions.   LKW: 09/25 tpa given?: no, out of window   SUBJECTIVE (INTERVAL HISTORY) No family members present today. The patient has multiple repetitive questions but again is in no acute distress.   OBJECTIVE Temp:  [88.3 F (31.3 C)-98.7 F (37.1 C)] 88.3 F (31.3 C) (09/29 0700) Pulse Rate:  [71-104] 104 (09/29 0303) Cardiac Rhythm:  [-] Normal sinus rhythm (09/29 0700) Resp:  [9-20] 18 (09/29 0303) BP: (118-143)/(81-90) 143/90 mmHg (09/29 0303) SpO2:  [98 %-99 %] 98 % (09/29 0303)  CBC:   Recent Labs Lab 09/18/15 1117 09/18/15 1720 09/20/15 0244  WBC 5.8 7.1 8.1  NEUTROABS 4.6  --   --   HGB 11.2* 12.4* 11.7*  HCT 34.4* 36.2* 35.7*  MCV 93.0 91.0 91.8  PLT 186 215 [id number]    Basic Metabolic Panel:   Recent Labs Lab 09/18/15 1117 09/18/15 1720 09/20/15 0244  NA 143  --  140  K 3.9  --  4.0  CL 104  --  102  CO2 29  --  27  GLUCOSE 163*  --  109*  BUN 32*  --  26*  CREATININE 4.18* 4.08* 4.10*  CALCIUM 9.3  --  9.6    Lipid Panel:     Component Value Date/Time   CHOL 139 09/19/2015 0250   TRIG 105 09/19/2015 0250   HDL 40* 09/19/2015 0250   CHOLHDL 3.5 09/19/2015 0250   VLDL 21 09/19/2015 0250   LDLCALC 78 09/19/2015 0250   HgbA1c:  Lab Results  Component Value Date   HGBA1C 5.1 09/19/2015   Urine Drug Screen:     Component Value Date/Time   LABOPIA NONE DETECTED 09/19/2015 1216   COCAINSCRNUR NONE DETECTED 09/19/2015 1216   LABBENZ POSITIVE* 09/19/2015 1216   AMPHETMU NONE DETECTED  09/19/2015 1216   THCU NONE DETECTED 09/19/2015 1216   LABBARB NONE DETECTED 09/19/2015 1216      IMAGING  I have personally reviewed the radiological images below and agree with the radiology interpretations.  Dg Chest 2 View 09/18/2015    No active cardiopulmonary disease.     Ct Head Wo Contrast 09/18/2015    1. Subacute nonhemorrhagic infarct in the left posterior inferior cerebellar arterial distribution. Possible lesser involvement in the contralateral cerebellum.   Mr Brain Wo Contrast 09/18/2015    Large acute LEFT cerebellar infarcts affecting the inferior and superior cerebellum on that side. These involve the LEFT PICA and SCA territories. There is early left-to-right shift and effacement of the fourth ventricle. The patient is at risk for hydrocephalus. No evidence for hemorrhagic transformation at this time.  No visible occlusion of the vertebral arteries or basilar artery.   MR MRA Head / Brain without contrast 09/19/2015 Progressive edema in the left cerebellum most consistent with acute infarct. There is possible hemorrhagic transformation. Followup MRI is suggested. No significant intracranial stenosis.  2D echo - - Left ventricle: The cavity size was normal. Systolic function was normal. The estimated ejection fraction was in the range of 55% to 60%.  Although no diagnostic regional wall motion abnormality was identified, this possibility cannot be completely excluded on the basis of this study. Left ventricular diastolic function parameters were probably normal.  LE venous doppler - pending  CUS - pending   PHYSICAL EXAM  Temp:  [88.3 F (31.3 C)-98.7 F (37.1 C)] 88.3 F (31.3 C) (09/29 0700) Pulse Rate:  [71-104] 104 (09/29 0303) Resp:  [9-20] 18 (09/29 0303) BP: (118-143)/(81-90) 143/90 mmHg (09/29 0303) SpO2:  [98 %-99 %] 98 % (09/29 0303)  General - Well nourished, well developed, in no apparent distress.  Ophthalmologic - Fundi not  visualized due to noncooperation  Cardiovascular - Regular rate and rhythm with no murmur.  Mental Status -  Level of arousal and orientation to time, place, and person were intact. Language including expression, naming, repetition, comprehension was assessed and found intact. Fund of Knowledge was assessed and was impaired.  As per mom, pt has MR and his cognitive capacity is at level of age 46-15.                                                                                                  .  Cranial Nerves II - XII - II - Visual field intact OU. III, IV, VI - Extraocular movements intact. V - Facial sensation intact bilaterally. VII - Facial movement intact bilaterally. VIII - Hearing & vestibular intact bilaterally. X - Palate elevates symmetrically. XI - Chin turning & shoulder shrug intact bilaterally. XII - Tongue protrusion intact.  Motor Strength - The patient's strength was normal in all extremities and pronator drift was absent.  Bulk was normal and fasciculations were absent.   Motor Tone - Muscle tone was assessed at the neck and appendages and was normal.  Reflexes - The patient's reflexes were 1+ in all extremities and he had no pathological reflexes.  Sensory - Light touch, temperature/pinprick were assessed and were symmetrical.    Coordination - The patient had left UE ataxia, LLE not able to test due to noncooperation. He has right hand tremor which has been for long time as per mom.   Gait and Station - not tested due to safety concerns.   ASSESSMENT/PLAN Mr. Kyle Valenzuela is a 54 y.o. male with history of CKD (refused for dialysis), MR, COPD, HTN, and schizophrenia presenting with difficulty walking, nausea and vomiting.  He did not receive IV t-PA due to late presentation.  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   Resultant  Left UE ataxia  MRI  Large acute LEFT cerebellar infarcts affecting the PICA and SCA  terrotories  MRA  - unremarkable  Repeat CT in am to rule out hydrocephalus  Carotid Doppler - pending  Lower extremity venous duplex - pending  2D Echo unremarkable  Autoimmune and hypercoagulable labs - pending  LDL - 78  HgbA1c 5.1  VTE prophylaxis - subq heparin Diet renal with fluid restriction Fluid restriction:: 1200 mL Fluid; Room service appropriate?: Yes; Fluid consistency:: Thin  no antithrombotic prior to admission, now on aspirin 325 mg orally every day.  Discussed with sister also the POA, no aggressive work up such as TEE or loop recorder. We recommend 30 day cardiac event monitor as outpt. Please set up with cardiology or cardiologist of his choice. POA agrees with the plan.  Patient counseled to be compliant with his antithrombotic medications  Ongoing aggressive stroke risk factor management  Hypertension  Stable  Permissive hypertension (OK if < 220/120) but gradually normalize in 5-7 days  Hyperlipidemia  Home meds:  No lipid lowering medications prior to admission  LDL 78, goal < 70  Add Lipitor 10 mg daily  Continue statin at discharge  Other Stroke Risk Factors  Cigarette smoker, quit smoking   Other Active Problems  Mild anemia  MR - level of age 67-15  Schizophrenia - on depakote  CKD - refused HD so far  Hospital day # 2  Rosalin Hawking, MD PhD Stroke Neurology 09/20/2015 10:59 AM    To contact Stroke Continuity [facility], please refer to [email or web address] After hours, contact General Neurology

## 2015-09-20 NOTE — Progress Notes (Signed)
VASCULAR LAB PRELIMINARY  PRELIMINARY  PRELIMINARY  PRELIMINARY  Carotid duplex completed.    Preliminary report:  1-39% ICA plaquing.  Vertebral artery flow is antegrade.   KANADY, CANDACE, RVT 09/20/2015, 11:59 AM

## 2015-09-20 NOTE — Progress Notes (Signed)
PROGRESS NOTE  Kyle Valenzuela [id number] DOB: 1961/08/23 DOA: 09/18/2015 PCP: Alonza Bogus, MD  Brief History 54 y.o. male with a history of schizophrenia, end-stage renal disease on hemodialysis who is resident of a group home, was brought to the emergency room today after he was noted to have difficulty ambulating. Patient reportedly had a fall yesterday resulting in head trauma. Patient is a difficult historian and appears somewhat unreliable in his answers. After noting staggering gait, CT brain in ED suggested left posterior cerebellar infarct. MRI of brain showed large L-cerebellar infarcts with Left to right shift. Assessment/Plan: Acute Left cerebellar stroke -09/18/15 MR brain--large left cerebellar infarcts affecting inferior and superior cerebellum with early left-to-right shift and effacement of the left ventricle -appreciate neurology follow up -Hemoglobin A1c--5.1 -LDL 78 -MRA brain --progressive edema in the left cerebellum consistent with acute infarction. No significant intracranial stenosis -TSH 0.507 -Echocardiogram--EF 55-60 percent, no WMA -Continue aspirin -Carotid duplex negative for hemodynamically significant stenosis -Lower extremity duplex --negative for DVT  -PT/OT-HHPT if group home able to accomodate -Repeat CT brain per neurology  CKD stage IV -Baseline creatinine 4.1-4.4 -follows Dr. Hinda Lenis in [place] -family does not want HD -Continue bicarbonate Hypertension -Restart carvedilol -hold amlodipine Schizophrenia -Continue Depakote, trazodone, Zyprexa Dyslipidemia -start atorvastatin   Family Communication: Mother updated at bedside Disposition Plan: Home when cleared by Neurology     Procedures/Studies: Dg Chest 2 View  09/18/2015   CLINICAL DATA:  Stroke  EXAM: CHEST  2 VIEW  COMPARISON:  04/20/2014  FINDINGS: The heart size and mediastinal contours are within normal limits. Both lungs are clear. The visualized  skeletal structures are unremarkable.  IMPRESSION: No active cardiopulmonary disease.   Electronically Signed   By: Kathreen Devoid   On: 09/18/2015 21:10   Ct Head Wo Contrast  09/19/2015   CLINICAL DATA:  Cerebellar infarct, evaluate for hemorrhage  EXAM: CT HEAD WITHOUT CONTRAST  TECHNIQUE: Contiguous axial images were obtained from the base of the skull through the vertex without intravenous contrast.  COMPARISON:  MRA brain dated 09/19/2015.  CT head dated 09/18/2015.  FINDINGS: Large left superior and inferior cerebellar infarcts. 12 mm right cerebellar midline shift, unchanged. No evidence of hemorrhagic transformation.  Effacement of the 4th ventricle (series 21/image 7). No associated hydrocephalus. No intraventricular hemorrhage.  No evidence of parenchymal hemorrhage or extra-axial fluid collection.  Mild subcortical white matter and periventricular small vessel ischemic changes.  Cerebral volume is within normal limits.  The visualized paranasal sinuses are essentially clear. The mastoid air cells are unopacified.  No evidence of calvarial fracture.  IMPRESSION: Stable left cerebellar infarcts, as above, without evidence of hemorrhagic transformation.   Electronically Signed   By: Julian Hy M.D.   On: 09/19/2015 16:52   Ct Head Wo Contrast  09/18/2015   CLINICAL DATA:  Unwitnessed fall last night. Fall again today striking left forehead. Vomiting yesterday. Initial encounter.  EXAM: CT HEAD WITHOUT CONTRAST  TECHNIQUE: Contiguous axial images were obtained from the base of the skull through the vertex without intravenous contrast.  COMPARISON:  None.  FINDINGS: There is a large area of peripheral low-density in the posterior inferior left cerebellar hemisphere consistent with a subacute nonhemorrhagic infarct in the posterior inferior cerebellar arterial distribution. There is possible minimal patchy low-density in the contralateral cerebellum as well. No evidence of supratentorial infarct,  acute hemorrhage, extra-axial fluid collection, midline shift or hydrocephalus.  The visualized paranasal sinuses, mastoid air  cells and middle ears are otherwise clear. The calvarium is intact.  IMPRESSION: 1. Subacute nonhemorrhagic infarct in the left posterior inferior cerebellar arterial distribution. Possible lesser involvement in the contralateral cerebellum. 2. These results were called by telephone at the time of interpretation on 09/18/2015 at 12:19 pm to Dr. Elnora Morrison , who verbally acknowledged these results.   Electronically Signed   By: Richardean Sale M.D.   On: 09/18/2015 12:21   Mr Brain Wo Contrast  09/18/2015   CLINICAL DATA:  Confusion, vomiting, and imbalance beginning 09/17/2015. Concern for posterior circulation ischemia.  EXAM: MRI HEAD WITHOUT CONTRAST  TECHNIQUE: Multiplanar, multiecho pulse sequences of the brain and surrounding structures were obtained without intravenous contrast.  COMPARISON:  CT head 09/18/2015.  FINDINGS: There are large acute LEFT cerebellar infarcts affecting two different vascular territories. There is involvement of nearly the entire LEFT posterior inferior cerebellar artery territory, involving not only the hemisphere but the vermis and tonsil on the LEFT. There is early mass effect with left-to-right shift and effacement of the fourth ventricle. The patient is at risk for hydrocephalus.  An additional area of acute infarction affects much of the LEFT superior cerebellar artery territory, largely sparing the vermis. The lateral cerebellum, LEFT anterior inferior cerebellar artery territory, is spared. No contralateral involvement of the RIGHT cerebellum. No supratentorial involvement. No visible hemorrhage.  Flow voids are maintained in both vertebral arteries and basilar artery. In addition, the LEFT posterior inferior cerebellar artery demonstrates patent flow void. Both internal carotid arteries are widely patent.  Normal for age cerebral volume. Minor  white matter disease, nonspecific. No foci of chronic hemorrhage.  No tonsillar herniation. Normal pituitary. Negative osseous structures. Extracranial soft tissues unremarkable.  IMPRESSION: Large acute LEFT cerebellar infarcts affecting the inferior and superior cerebellum on that side. These involve the LEFT PICA and SCA territories. There is early left-to-right shift and effacement of the fourth ventricle. The patient is at risk for hydrocephalus. No evidence for hemorrhagic transformation at this time.  No visible occlusion of the vertebral arteries or basilar artery.  Findings discussed with ordering Sussan Meter.   Electronically Signed   By: Staci Righter M.D.   On: 09/18/2015 14:14   Mr Jodene Nam Head/brain Wo Cm  09/19/2015   ADDENDUM REPORT: 09/19/2015 16:05 ADDENDUM: MRI was performed on 09/18/2015. I did not appreciate this at the time original dictation. This reveals acute infarct in the left inferior and superior cerebellum. No hemorrhage is seen at that time however there is suggestion of hemorrhagic transformation on the MRA source images today. Follow-up CT or MRI suggested. These results were called by telephone at the time of interpretation on 09/19/2015 at 4:04 pm to Dr. Carles Collet, who verbally acknowledged these results. Electronically Signed   By: Franchot Gallo M.D.   On: 09/19/2015 16:05  09/19/2015   CLINICAL DATA:  Stroke  EXAM: MRA HEAD WITHOUT CONTRAST  TECHNIQUE: Angiographic images of the Circle of Willis were obtained using MRA technique without intravenous contrast.  COMPARISON:  CT head 09/18/2015.  FINDINGS: Left cerebellar lesion has progressed since the prior study with increased edema and mass-effect on the fourth ventricle. This is most likely acute infarct. This appears to involve inferior and superior cerebellum on the left. MRI of brain suggested to confirm. Possible hemorrhagic transformation.  Both vertebral arteries are patent to the basilar. PICA patent bilaterally. AICA patent  bilaterally. Basilar widely patent. Superior cerebellar and posterior cerebral arteries patent bilaterally. Fetal origin right posterior cerebral artery  Internal  carotid artery patent bilaterally without stenosis. Anterior and middle cerebral arteries widely patent bilaterally without stenosis.  Negative for cerebral aneurysm.  IMPRESSION: Progressive edema in the left cerebellum most consistent with acute infarct. There is possible hemorrhagic transformation. Followup MRI is suggested.  No significant intracranial stenosis.  Electronically Signed: By: Franchot Gallo M.D. On: 09/19/2015 15:50         Subjective: Patient denies fevers, chills, headache, chest pain, dyspnea, nausea, vomiting, diarrhea, abdominal pain, dysuria, hematuria   Objective: Filed Vitals:   09/20/15 0700 09/20/15 0737 09/20/15 1251 09/20/15 1300  BP:  162/90 127/84   Pulse:  101 75   Temp: 98.3 F (36.8 C)   97.6 F (36.4 C)  TempSrc: Oral   Oral  Resp:  14 12   Height:      Weight:      SpO2:  99% 98%     Intake/Output Summary (Last 24 hours) at 09/20/15 1451 Last data filed at 09/20/15 1400  Gross per 24 hour  Intake   1530 ml  Output   3350 ml  Net  -1820 ml   Weight change:  Exam:   General:  Pt is alert, follows commands appropriately, not in acute distress  HEENT: No icterus, No thrush, No neck mass, [place]/AT  Cardiovascular: RRR, S1/S2, no rubs, no gallops  Respiratory: CTA bilaterally, no wheezing, no crackles, no rhonchi  Abdomen: Soft/+BS, non tender, non distended, no guarding  Extremities: No edema, No lymphangitis, No petechiae, No rashes, no synovitis  Data Reviewed: Basic Metabolic Panel:  Recent Labs Lab 09/18/15 1117 09/18/15 1720 09/20/15 0244  NA 143  --  140  K 3.9  --  4.0  CL 104  --  102  CO2 29  --  27  GLUCOSE 163*  --  109*  BUN 32*  --  26*  CREATININE 4.18* 4.08* 4.10*  CALCIUM 9.3  --  9.6   Liver Function Tests: No results for input(s): AST, ALT,  ALKPHOS, BILITOT, PROT, ALBUMIN in the last 168 hours. No results for input(s): LIPASE, AMYLASE in the last 168 hours. No results for input(s): AMMONIA in the last 168 hours. CBC:  Recent Labs Lab 09/18/15 1117 09/18/15 1720 09/20/15 0244  WBC 5.8 7.1 8.1  NEUTROABS 4.6  --   --   HGB 11.2* 12.4* 11.7*  HCT 34.4* 36.2* 35.7*  MCV 93.0 91.0 91.8  PLT 186 215 202   Cardiac Enzymes: No results for input(s): CKTOTAL, CKMB, CKMBINDEX, TROPONINI in the last 168 hours. BNP: Invalid input(s): POCBNP CBG: No results for input(s): GLUCAP in the last 168 hours.  Recent Results (from the past 240 hour(s))  MRSA PCR Screening     Status: None   Collection Time: 09/18/15  5:01 PM  Result Value Ref Range Status   MRSA by PCR NEGATIVE NEGATIVE Final    Comment:        The GeneXpert MRSA Assay (FDA approved for NASAL specimens only), is one component of a comprehensive MRSA colonization surveillance program. It is not intended to diagnose MRSA infection nor to guide or monitor treatment for MRSA infections.      Scheduled Meds: . ALPRAZolam  1 mg Oral QID  . aspirin  300 mg Rectal Daily   Or  . aspirin  325 mg Oral Daily  . atorvastatin  10 mg Oral q1800  . divalproex  500 mg Oral BID  . heparin  5,000 Units Subcutaneous 3 times per day  . [START  ON 09/21/2015] Influenza vac split quadrivalent PF  0.5 mL Intramuscular Tomorrow-1000  . OLANZapine  10 mg Oral Daily  . [START ON 09/21/2015] pneumococcal 23 valent vaccine  0.5 mL Intramuscular Tomorrow-1000  . sodium bicarbonate  650 mg Oral BID  . traZODone  150 mg Oral QHS   Continuous Infusions:    TAT, DAVID, DO  [facility] Pager [phone]  If 7PM-7AM, please contact night-coverage [email or web address] Password [email or web address] 09/20/2015, 2:51 PM   LOS: 2 days

## 2015-09-20 NOTE — Progress Notes (Addendum)
Pt is from [facility]) in [place]. CM received consult: Possible return to group home--will they allow for HHPT? Referral placed with CSW per CM. CM to f/u with disposition needs. Whitman Hero CM,BSN,CM [phone]

## 2015-09-20 NOTE — Progress Notes (Signed)
VASCULAR LAB PRELIMINARY  PRELIMINARY  PRELIMINARY  PRELIMINARY  Bilateral lower extremity venous duplex completed.    Preliminary report:  There is no DVT or SVT noted in the bilateral lower extremities.   Shayden Gingrich, RVT 09/20/2015, 12:12 PM

## 2015-09-21 ENCOUNTER — Inpatient Hospital Stay
Admission: RE | Admit: 2015-09-21 | Discharge: 2015-10-13 | Disposition: A | Discharge status: Home / Self Care | Payer: Medicaid Other | Source: Ambulatory Visit | Attending: Pulmonary Disease | Admitting: Pulmonary Disease

## 2015-09-21 ENCOUNTER — Inpatient Hospital Stay (HOSPITAL_COMMUNITY): Payer: Medicare Other

## 2015-09-21 DIAGNOSIS — E7211 Homocystinuria: Secondary | ICD-10-CM | POA: Insufficient documentation

## 2015-09-21 DIAGNOSIS — R1310 Dysphagia, unspecified: Principal | ICD-10-CM

## 2015-09-21 DIAGNOSIS — E7219 Other disorders of sulfur-bearing amino-acid metabolism: Secondary | ICD-10-CM

## 2015-09-21 LAB — LUPUS ANTICOAGULANT PANEL
DRVVT: 42.3 s (ref 0.0–55.1)
PTT LA: 37.6 s (ref 0.0–50.0)

## 2015-09-21 LAB — BETA-2-GLYCOPROTEIN I ABS, IGG/M/A: Beta-2 Glyco I IgG: <9 GPI IgG units (ref 0–20)

## 2015-09-21 LAB — ANTINUCLEAR ANTIBODIES, IFA: ANA Ab, IFA: NEGATIVE

## 2015-09-21 MED ORDER — CYANOCOBALAMIN 500 MCG PO TABS
500.0000 ug | ORAL_TABLET | Freq: Every day | ORAL | Status: DC
  Administered 2015-09-21: 500 ug via ORAL
  Filled 2015-09-21: qty 1

## 2015-09-21 MED ORDER — PYRIDOXINE HCL 25 MG PO TABS
25.0000 mg | ORAL_TABLET | Freq: Every day | ORAL | Status: DC
  Administered 2015-09-21: 25 mg via ORAL
  Filled 2015-09-21: qty 1

## 2015-09-21 MED ORDER — ATORVASTATIN CALCIUM 10 MG PO TABS: 10.0000 mg | ORAL_TABLET | Freq: Every day | ORAL | Status: AC

## 2015-09-21 MED ORDER — FOLIC ACID 1 MG PO TABS
1.0000 mg | ORAL_TABLET | Freq: Every day | ORAL | Status: DC
  Administered 2015-09-21: 1 mg via ORAL
  Filled 2015-09-21: qty 1

## 2015-09-21 MED ORDER — ASPIRIN 325 MG PO TABS
325.0000 mg | ORAL_TABLET | Freq: Every day | ORAL | Status: DC
Start: 2015-09-21 — End: 2019-11-13

## 2015-09-21 MED ORDER — AMLODIPINE BESYLATE 2.5 MG PO TABS: 2.5000 mg | ORAL_TABLET | Freq: Every day | ORAL | Status: AC

## 2015-09-21 NOTE — Clinical Social Work Placement (Signed)
   CLINICAL SOCIAL WORK PLACEMENT  NOTE  Date:  09/21/2015  Patient Details  Name: Kyle Valenzuela MRN: [id number] Date of Birth: Mar 22, 1961  Clinical Social Work is seeking post-discharge placement for this patient at the [place] level of care (*CSW will initial, date and re-position this form in  chart as items are completed):  Yes   Patient/family provided with [place] Work Department's list of facilities offering this level of care within the geographic area requested by the patient (or if unable, by the patient's family).  Yes   Patient/family informed of their freedom to choose among providers that offer the needed level of care, that participate in Medicare, Medicaid or managed care program needed by the patient, have an available bed and are willing to accept the patient.  Yes   Patient/family informed of [facility] [place]'s ownership interest in [facility] and [facility], as well as of the fact that they are under no obligation to receive care at these facilities.  PASRR submitted to EDS on 09/21/15     PASRR number received on 09/21/15     Existing PASRR number confirmed on       FL2 transmitted to all facilities in geographic area requested by pt/family on 09/21/15     FL2 transmitted to all facilities within larger geographic area on       Patient informed that his/her managed care company has contracts with or will negotiate with certain facilities, including the following:        Yes   Patient/family informed of bed offers received.  Patient chooses bed at [facility]     Physician recommends and patient chooses bed at      Patient to be transferred to [facility] on 09/21/15.  Patient to be transferred to facility by [facility]      Patient family notified on 09/21/15 of transfer.  Name of family member notified:  Blaine Hamper, mother nad Jeronimo Greaves group supervior      PHYSICIAN       Additional  Comment:    _______________________________________________ Greta Doom, LCSW 09/21/2015, 12:38 PM

## 2015-09-21 NOTE — Progress Notes (Signed)
Physical Therapy Treatment Patient Details Name: Kyle Valenzuela MRN: [id number] DOB: 26-Jun-1961 Today's Date: 09/21/2015    History of Present Illness This 54 y.o. Male with h/o schizophrenia and ESRD admitted with n/v and difficulty walking.  MRI showed Lt cerebellar infarct.      PT Comments    Pt showing more gait instability and fears of falling, but emphasis on gait stability.   Follow Up Recommendations  SNF     Equipment Recommendations  Rolling walker with 5" wheels    Recommendations for Other Services       Precautions / Restrictions Precautions Precautions: Fall Restrictions Weight Bearing Restrictions: No    Mobility  Bed Mobility Overal bed mobility: Needs Assistance Bed Mobility: Supine to Sit;Sit to Supine     Supine to sit: Supervision Sit to supine: Supervision      Transfers Overall transfer level: Needs assistance Equipment used: Rolling walker (2 wheeled) Transfers: Sit to/from Stand Sit to Stand: Min guard            Ambulation/Gait Ambulation/Gait assistance: Min guard;Min assist;+2 physical assistance;+2 safety/equipment (2 person HHA without RW due to fear of falling) Ambulation Distance (Feet): 150 Feet Assistive device: Rolling walker (2 wheeled);2 person hand held assist Gait Pattern/deviations: Step-through pattern;Decreased step length - right;Decreased step length - left   Gait velocity interpretation: Below normal speed for age/gender General Gait Details: short, unsteady step, staying too far away from the RW.  More unsteady with HHA and no RW.   Stairs            Wheelchair Mobility    Modified Rankin (Stroke Patients Only) Modified Rankin (Stroke Patients Only) Modified Rankin: Moderately severe disability     Balance Overall balance assessment: Needs assistance Sitting-balance support: No upper extremity supported Sitting balance-Leahy Scale: Good     Standing balance support: No upper extremity  supported Standing balance-Leahy Scale: Poor Standing balance comment: needs UE assist today                    Cognition Arousal/Alertness: Awake/alert Behavior During Therapy: WFL for tasks assessed/performed Overall Cognitive Status: History of cognitive impairments - at baseline                      Exercises      General Comments        Pertinent Vitals/Pain Pain Assessment: No/denies pain    Home Living                      Prior Function            PT Goals (current goals can now be found in the care plan section) Acute Rehab PT Goals Patient Stated Goal: Pt did not state  PT Goal Formulation: Patient unable to participate in goal setting Time For Goal Achievement: 09/26/15 Potential to Achieve Goals: Fair Progress towards PT goals: Progressing toward goals    Frequency  Min 3X/week    PT Plan Discharge plan needs to be updated    Co-evaluation PT/OT/SLP Co-Evaluation/Treatment: Yes Reason for Co-Treatment: Complexity of the patient's impairments (multi-system involvement) PT goals addressed during session: Mobility/safety with mobility OT goals addressed during session: ADL's and self-care     End of Session   Activity Tolerance: Patient tolerated treatment well Patient left: in bed;with call bell/phone within reach;with family/visitor present     Time: 1041-1110 PT Time Calculation (min) (ACUTE ONLY): 29 min  Charges:  $Gait Training:  8-22 mins                    G Codes:      Mottinger, Tessie Fass 09/21/2015, 5:59 PM  09/21/2015  Donnella Sham, [place] [phone]  (pager)

## 2015-09-21 NOTE — Progress Notes (Signed)
Utilization review completed.  

## 2015-09-21 NOTE — Care Management Note (Signed)
Case Management Note  Patient Details  Name: Kyle Valenzuela MRN: [id number] Date of Birth: 1961-03-28  Subjective/Objective:      Staff reports plan for DC today.  SW notified.  Already aware of situation and will let this writer know if I can assist.               Action/Plan:   Expected Discharge Date:                  Expected Discharge Plan:  Group Home ([facility] in [place])  In-House Referral:  Clinical Social Work  Discharge planning Services  CM Consult  Post Acute Care Choice:    Choice offered to:     DME Arranged:    DME Agency:     HH Arranged:    [place] Agency:     Status of Service:  In process, will continue to follow  Medicare Important Message Given:    Date Medicare IM Given:    Medicare IM give by:    Date Additional Medicare IM Given:    Additional Medicare Important Message give by:     If discussed at [place] of Stay Meetings, dates discussed:    Additional Comments:  Vergie Living, RN 09/21/2015, 10:43 AM

## 2015-09-21 NOTE — Progress Notes (Signed)
Report called to [facility] home, primary RN states no further questions at this time. transport on the way, will continue to monitor patient.

## 2015-09-21 NOTE — Care Management Important Message (Signed)
Important Message  Patient Details  Name: Kyle Valenzuela MRN: [id number] Date of Birth: 05/14/1961   Medicare Important Message Given:  Yes-second notification given    Nathen May 09/21/2015, 12:38 PM

## 2015-09-21 NOTE — Progress Notes (Signed)
Occupational Therapy Treatment Patient Details Name: ERBY CANSECO MRN: [id number] DOB: 1961-12-19 Today's Date: 09/21/2015    History of present illness This 54 y.o. Male with h/o schizophrenia and ESRD admitted with n/v and difficulty walking.  MRI showed Lt cerebellar infarct.     OT comments  This 54 yo male admitted with above is making progress towards S level goals, but is not there yet. Spoke with staff at group home and they cannot provide min guard A any time he gets up on his feet and now pt using a RW (not using one before ). Pt will benefit from a short stay at SNF to get to a S level and with hopeful return to group home. See PT note for ambulation.  Follow Up Recommendations  SNF.         Precautions / Restrictions Precautions Precautions: Fall Restrictions Weight Bearing Restrictions: No       Mobility Bed Mobility Overal bed mobility: Needs Assistance Bed Mobility: Supine to Sit;Sit to Supine     Supine to sit: Supervision Sit to supine: Supervision      Transfers Overall transfer level: Needs assistance Equipment used: Rolling walker (2 wheeled) Transfers: Sit to/from Stand Sit to Stand: Min guard              Balance Overall balance assessment: Needs assistance Sitting-balance support: Feet supported Sitting balance-Leahy Scale: Good     Standing balance support: Bilateral upper extremity supported Standing balance-Leahy Scale: Poor Standing balance comment: Pt felt like he needed to use a RW today for fear of falling                   ADL Overall ADL's : Needs assistance/impaired     Grooming: Wash/dry face;Min guard;Standing (at sink)                   Armed forces technical officer: Min guard;Ambulation;RW                              Cognition   Behavior During Therapy: WFL for tasks assessed/performed Overall Cognitive Status: History of cognitive impairments - at baseline                                     Pertinent Vitals/ Pain       Pain Assessment: No/denies pain         Frequency Min 2X/week     Progress Toward Goals  OT Goals(current goals can now be found in the care plan section)  Progress towards OT goals: Progressing toward goals     Plan Discharge plan needs to be updated    Co-evaluation    PT/OT/SLP Co-Evaluation/Treatment: Yes Reason for Co-Treatment: Other (comment) (pt needed to be seen for determination of D/C and we were both there at the same time)   OT goals addressed during session: ADL's and self-care      End of Session Equipment Utilized During Treatment: Rolling walker   Activity Tolerance Patient tolerated treatment well   Patient Left in bed;with call bell/phone within reach;with bed alarm set;with family/visitor present           Time: 1041-1110 OT Time Calculation (min): 29 min  Charges: OT General Charges $OT Visit: 1 Procedure OT Treatments $Self Care/Home Management : 8-22 mins  Almon Register [id number] 09/21/2015, 3:22 PM

## 2015-09-21 NOTE — Clinical Social Work Note (Signed)
Clinical Social Work Assessment  Patient Details  Name: Kyle Valenzuela MRN: [id number] Date of Birth: 16-Mar-1961  Date of referral:  09/20/15               Reason for consult:   ([facility] )                Permission sought to share information with:  Other (Group Home Supervisor ) Permission granted to share information::     Name::     Kyle Valenzuela   Agency::  [facility]   Relationship::  Group Home Supervisor   Contact Information:  [phone]  Housing/Transportation Living arrangements for the past 2 months:  [place] of Information:   (Group Home Supervisor ) Patient Interpreter Needed:  None Criminal Activity/Legal Involvement Pertinent to Current Situation/Hospitalization:  No - Comment as needed Significant Relationships:  Warehouse manager Lives with:   (Group Home ) Do you feel safe going back to the place where you live?  Yes Need for family participation in patient care:  No (Coment)  Care giving concerns:  Kyle Valenzuela wanted to know if the pt is back at his baseline.   Social Worker assessment / plan: CSW, the case manager, Dr. Carles Collet, PT/OT, Jenny Reichmann, and [place] discussed the pt discharge plan. Every one agreed that the pt should go to SNF. Kyle Valenzuela reported that he prefers [facility]. CSW contacted [facility] to confirmed a bed offer. Awaiting a call back.      Employment status:  Disabled (Comment on whether or not currently receiving Disability) Insurance information:  Medicare PT Recommendations:  Home with [place] / Referral to community resources:   (None )  Patient/Family's Response to care: Jenny Reichmann and Izora Gala reported that the care in which the pt has received has been well.   Patient/Family's Understanding of and Emotional Response to Diagnosis, Current Treatment, and Prognosis: Jenny Reichmann and Izora Gala acknowledged the pt's current diagnosis. Kyle Valenzuela and Izora Gala expressed wanting the pt's gait to be more stable prior to him going home.    Emotional Assessment Appearance:  Appears stated age Attitude/Demeanor/Rapport:   (Apprpriate ) Affect (typically observed):  Appropriate Orientation:  Oriented to Situation, Oriented to Place, Oriented to  Time, Oriented to Self Alcohol / Substance use:  Not Applicable Psych involvement (Current and /or in the community):  No (Comment)  Discharge Needs  Concerns to be addressed:  Denies Needs/Concerns at this time Readmission within the last 30 days:  No Current discharge risk:  None Barriers to Discharge:  No Barriers Identified   Bibbs, Dysheka, LCSW 09/21/2015, 10:50 AM

## 2015-09-21 NOTE — Discharge Summary (Signed)
Physician Discharge Summary  Kyle Valenzuela [id number] DOB: [birth year] DOA: 09/18/2015  PCP: Kyle Bogus, MD  Admit date: 09/18/2015 Discharge date: 09/21/2015  Recommendations for Outpatient Follow-up:  1. Pt will need to follow up with PCP in 2 weeks post discharge 2. Please obtain BMP in one week  Discharge Diagnoses:  Acute Left cerebellar stroke -09/18/15 MR brain--large left cerebellar infarcts affecting inferior and superior cerebellum with early left-to-right shift and effacement of the left ventricle -appreciate neurology follow up -Hemoglobin A1c--5.1 -LDL 78 -MRA brain --progressive edema in the left cerebellum consistent with acute infarction. No significant intracranial stenosis -TSH 0.507 -Echocardiogram--EF 55-60 percent, no WMA -Continue aspirin 325 mg daily per neurology -Carotid duplex negative for hemodynamically significant stenosis -Lower extremity duplex --negative for DVT  -PT/OT-->SNF due to gait instability--see below -9/30--Repeat CT brain per neurology--stable mass effect, no hemorrhagic conversion CKD stage IV -Baseline creatinine 4.1-4.4 -follows Dr. Hinda Valenzuela in [place] -family does not want HD -Continue bicarbonate Hypertension -Restart carvedilol -restart lower dose amlodipine 2.5 mg daily and titrate as needed in outpt setting Schizophrenia -Continue Depakote, trazodone, Zyprexa Dyslipidemia -start atorvastatin 10 mg daily  Discharge Condition: stable  Disposition: home Follow-up Information    Follow up with Xu,Jindong, MD In 2 months.   Specialty:  Neurology   Why:  Stroke Clinic, Office will call you with appointment date & time   Contact information:   [address] [place] [address] [phone]       Diet:renal Wt Readings from Last 3 Encounters:  09/18/15 72.576 kg (160 lb)  05/03/15 81.647 kg (180 lb)  11/23/14 81.647 kg (180 lb)    History of present illness:  54 y.o. male with a  history of schizophrenia, end-stage renal disease on hemodialysis who is resident of a group home, was brought to the emergency room today after he was noted to have difficulty ambulating. Patient reportedly had a fall yesterday resulting in head trauma. Patient is a difficult historian and appears somewhat unreliable in his answers. After noting staggering gait, CT brain in ED suggested left posterior cerebellar infarct. MRI of brain showed large L-cerebellar infarcts with Left to right shift. Repeat CT of the brain did not show any hemorrhagic conversion. In addition, a revealed stable mass effect without any worsening. The patient was evaluated by physical therapy and occupational therapy who felt that he may benefit from a skilled nursing facility. The patient's group home director also this is the patient and did not feel that his gait instability would allow him to go back to the group home at this time. Therefore after discussion with the patient's family and group home director, it was felt that the patient would best benefit from skilled nursing facility for a period of time to increase his mobility and stability after which he can return to the group home.  Consultants: Neurology  Discharge Exam: Filed Vitals:   09/21/15 1133  BP:   Pulse:   Temp: 97.4 F (36.3 C)  Resp:    Filed Vitals:   09/20/15 2318 09/21/15 0400 09/21/15 0911 09/21/15 1133  BP: 145/93 114/85 140/102   Pulse: 75     Temp: 97.7 F (36.5 C) 98 F (36.7 C) 98 F (36.7 C) 97.4 F (36.3 C)  TempSrc: Oral Oral Oral Oral  Resp: 19 21 17    Height:      Weight:      SpO2: 98% 99% 98%    General: Awake and alert, NAD, pleasant, cooperative Cardiovascular: RRR,  no rub, no gallop, no S3 Respiratory: CTAB, no wheeze, no rhonchi Abdomen:soft, nontender, nondistended, positive bowel sounds Extremities: No edema, No lymphangitis, no petechiae  Discharge Instructions      Discharge Instructions    Ambulatory  referral to Neurology    Complete by:  As directed   Dr. Erlinda Valenzuela requests followup in 2 months     Diet - low sodium heart healthy    Complete by:  As directed      Increase activity slowly    Complete by:  As directed             Medication List    TAKE these medications        albuterol 108 (90 BASE) MCG/ACT inhaler  Commonly known as:  PROVENTIL HFA;VENTOLIN HFA  Inhale 2 puffs into the lungs every 4 (four) hours as needed. For shortness of breath     ALPRAZolam 1 MG tablet  Commonly known as:  XANAX  Take 1 mg by mouth 4 (four) times daily.     amLODipine 2.5 MG tablet  Commonly known as:  NORVASC  Take 1 tablet (2.5 mg total) by mouth daily.     aspirin 325 MG tablet  Take 1 tablet (325 mg total) by mouth daily.     atorvastatin 10 MG tablet  Commonly known as:  LIPITOR  Take 1 tablet (10 mg total) by mouth daily at 6 PM.     carvedilol 6.25 MG tablet  Commonly known as:  COREG  Take 6.25 mg by mouth 2 (two) times daily with a meal.     divalproex 500 MG DR tablet  Commonly known as:  DEPAKOTE  Take 500 mg by mouth 2 (two) times daily.     iron polysaccharides 150 MG capsule  Commonly known as:  NIFEREX  Take 150 mg by mouth daily.     OLANZapine 10 MG tablet  Commonly known as:  ZYPREXA  Take 10 mg by mouth daily.     polyethylene glycol powder powder  Commonly known as:  GLYCOLAX/MIRALAX  Take 17 g by mouth daily.     sodium bicarbonate 650 MG tablet  Take 650 mg by mouth 2 (two) times daily.     sodium chloride 0.65 % Soln nasal spray  Commonly known as:  OCEAN  Place 1 spray into both nostrils as needed for congestion.     traZODone 150 MG tablet  Commonly known as:  DESYREL  Take 150 mg by mouth at bedtime.     zolpidem 10 MG tablet  Commonly known as:  AMBIEN  Take 10 mg by mouth at bedtime as needed for sleep.         The results of significant diagnostics from this hospitalization (including imaging, microbiology, ancillary and  laboratory) are listed below for reference.    Significant Diagnostic Studies: Dg Chest 2 View  09/18/2015   CLINICAL DATA:  Stroke  EXAM: CHEST  2 VIEW  COMPARISON:  04/20/2014  FINDINGS: The heart size and mediastinal contours are within normal limits. Both lungs are clear. The visualized skeletal structures are unremarkable.  IMPRESSION: No active cardiopulmonary disease.   Electronically Signed   By: Kathreen Devoid   On: 09/18/2015 21:10   Ct Head Wo Contrast  09/21/2015   CLINICAL DATA:  Stroke follow-up  EXAM: CT HEAD WITHOUT CONTRAST  TECHNIQUE: Contiguous axial images were obtained from the base of the skull through the vertex without intravenous contrast.  COMPARISON:  Two days  ago  FINDINGS: Skull and Sinuses:Negative for fracture or destructive process.  Orbits: Negative  Brain: Cytotoxic edema in the upper and lower left cerebellum correlating with known infarct. Stable local mass effect with fourth ventricular effacement. Stable lateral and third ventricular volume with no indication of hydrocephalus. No hemorrhagic conversion. No evidence of infarct extension.  IMPRESSION: Recent left cerebellar infarct with stable mass effect. No hemorrhagic conversion or ventriculomegaly.   Electronically Signed   By: Monte Fantasia M.D.   On: 09/21/2015 03:12   Ct Head Wo Contrast  09/19/2015   CLINICAL DATA:  Cerebellar infarct, evaluate for hemorrhage  EXAM: CT HEAD WITHOUT CONTRAST  TECHNIQUE: Contiguous axial images were obtained from the base of the skull through the vertex without intravenous contrast.  COMPARISON:  MRA brain dated 09/19/2015.  CT head dated 09/18/2015.  FINDINGS: Large left superior and inferior cerebellar infarcts. 12 mm right cerebellar midline shift, unchanged. No evidence of hemorrhagic transformation.  Effacement of the 4th ventricle (series 21/image 7). No associated hydrocephalus. No intraventricular hemorrhage.  No evidence of parenchymal hemorrhage or extra-axial fluid  collection.  Mild subcortical white matter and periventricular small vessel ischemic changes.  Cerebral volume is within normal limits.  The visualized paranasal sinuses are essentially clear. The mastoid air cells are unopacified.  No evidence of calvarial fracture.  IMPRESSION: Stable left cerebellar infarcts, as above, without evidence of hemorrhagic transformation.   Electronically Signed   By: Julian Hy M.D.   On: 09/19/2015 16:52   Ct Head Wo Contrast  09/18/2015   CLINICAL DATA:  Unwitnessed fall last night. Fall again today striking left forehead. Vomiting yesterday. Initial encounter.  EXAM: CT HEAD WITHOUT CONTRAST  TECHNIQUE: Contiguous axial images were obtained from the base of the skull through the vertex without intravenous contrast.  COMPARISON:  None.  FINDINGS: There is a large area of peripheral low-density in the posterior inferior left cerebellar hemisphere consistent with a subacute nonhemorrhagic infarct in the posterior inferior cerebellar arterial distribution. There is possible minimal patchy low-density in the contralateral cerebellum as well. No evidence of supratentorial infarct, acute hemorrhage, extra-axial fluid collection, midline shift or hydrocephalus.  The visualized paranasal sinuses, mastoid air cells and middle ears are otherwise clear. The calvarium is intact.  IMPRESSION: 1. Subacute nonhemorrhagic infarct in the left posterior inferior cerebellar arterial distribution. Possible lesser involvement in the contralateral cerebellum. 2. These results were called by telephone at the time of interpretation on 09/18/2015 at 12:19 pm to Dr. Elnora Morrison , who verbally acknowledged these results.   Electronically Signed   By: Richardean Sale M.D.   On: 09/18/2015 12:21   Mr Brain Wo Contrast  09/18/2015   CLINICAL DATA:  Confusion, vomiting, and imbalance beginning 09/17/2015. Concern for posterior circulation ischemia.  EXAM: MRI HEAD WITHOUT CONTRAST  TECHNIQUE:  Multiplanar, multiecho pulse sequences of the brain and surrounding structures were obtained without intravenous contrast.  COMPARISON:  CT head 09/18/2015.  FINDINGS: There are large acute LEFT cerebellar infarcts affecting two different vascular territories. There is involvement of nearly the entire LEFT posterior inferior cerebellar artery territory, involving not only the hemisphere but the vermis and tonsil on the LEFT. There is early mass effect with left-to-right shift and effacement of the fourth ventricle. The patient is at risk for hydrocephalus.  An additional area of acute infarction affects much of the LEFT superior cerebellar artery territory, largely sparing the vermis. The lateral cerebellum, LEFT anterior inferior cerebellar artery territory, is spared. No contralateral involvement of  the RIGHT cerebellum. No supratentorial involvement. No visible hemorrhage.  Flow voids are maintained in both vertebral arteries and basilar artery. In addition, the LEFT posterior inferior cerebellar artery demonstrates patent flow void. Both internal carotid arteries are widely patent.  Normal for age cerebral volume. Minor white matter disease, nonspecific. No foci of chronic hemorrhage.  No tonsillar herniation. Normal pituitary. Negative osseous structures. Extracranial soft tissues unremarkable.  IMPRESSION: Large acute LEFT cerebellar infarcts affecting the inferior and superior cerebellum on that side. These involve the LEFT PICA and SCA territories. There is early left-to-right shift and effacement of the fourth ventricle. The patient is at risk for hydrocephalus. No evidence for hemorrhagic transformation at this time.  No visible occlusion of the vertebral arteries or basilar artery.  Findings discussed with ordering provider.   Electronically Signed   By: Staci Righter M.D.   On: 09/18/2015 14:14   Mr Jodene Nam Head/brain Wo Cm  09/19/2015   ADDENDUM REPORT: 09/19/2015 16:05 ADDENDUM: MRI was performed on  09/18/2015. I did not appreciate this at the time original dictation. This reveals acute infarct in the left inferior and superior cerebellum. No hemorrhage is seen at that time however there is suggestion of hemorrhagic transformation on the MRA source images today. Follow-up CT or MRI suggested. These results were called by telephone at the time of interpretation on 09/19/2015 at 4:04 pm to Dr. Carles Collet, who verbally acknowledged these results. Electronically Signed   By: Franchot Gallo M.D.   On: 09/19/2015 16:05  09/19/2015   CLINICAL DATA:  Stroke  EXAM: MRA HEAD WITHOUT CONTRAST  TECHNIQUE: Angiographic images of the Circle of Willis were obtained using MRA technique without intravenous contrast.  COMPARISON:  CT head 09/18/2015.  FINDINGS: Left cerebellar lesion has progressed since the prior study with increased edema and mass-effect on the fourth ventricle. This is most likely acute infarct. This appears to involve inferior and superior cerebellum on the left. MRI of brain suggested to confirm. Possible hemorrhagic transformation.  Both vertebral arteries are patent to the basilar. PICA patent bilaterally. AICA patent bilaterally. Basilar widely patent. Superior cerebellar and posterior cerebral arteries patent bilaterally. Fetal origin right posterior cerebral artery  Internal carotid artery patent bilaterally without stenosis. Anterior and middle cerebral arteries widely patent bilaterally without stenosis.  Negative for cerebral aneurysm.  IMPRESSION: Progressive edema in the left cerebellum most consistent with acute infarct. There is possible hemorrhagic transformation. Followup MRI is suggested.  No significant intracranial stenosis.  Electronically Signed: By: Franchot Gallo M.D. On: 09/19/2015 15:50     Microbiology: Recent Results (from the past 240 hour(s))  MRSA PCR Screening     Status: None   Collection Time: 09/18/15  5:01 PM  Result Value Ref Range Status   MRSA by PCR NEGATIVE NEGATIVE  Final    Comment:        The GeneXpert MRSA Assay (FDA approved for NASAL specimens only), is one component of a comprehensive MRSA colonization surveillance program. It is not intended to diagnose MRSA infection nor to guide or monitor treatment for MRSA infections.      Labs: Basic Metabolic Panel:  Recent Labs Lab 09/18/15 1117 09/18/15 1720 09/20/15 0244  NA 143  --  140  K 3.9  --  4.0  CL 104  --  102  CO2 29  --  27  GLUCOSE 163*  --  109*  BUN 32*  --  26*  CREATININE 4.18* 4.08* 4.10*  CALCIUM 9.3  --  9.6  Liver Function Tests: No results for input(s): AST, ALT, ALKPHOS, BILITOT, PROT, ALBUMIN in the last 168 hours. No results for input(s): LIPASE, AMYLASE in the last 168 hours. No results for input(s): AMMONIA in the last 168 hours. CBC:  Recent Labs Lab 09/18/15 1117 09/18/15 1720 09/20/15 0244  WBC 5.8 7.1 8.1  NEUTROABS 4.6  --   --   HGB 11.2* 12.4* 11.7*  HCT 34.4* 36.2* 35.7*  MCV 93.0 91.0 91.8  PLT 186 215 202   Cardiac Enzymes: No results for input(s): CKTOTAL, CKMB, CKMBINDEX, TROPONINI in the last 168 hours. BNP: Invalid input(s): POCBNP CBG: No results for input(s): GLUCAP in the last 168 hours.  Time coordinating discharge:  Greater than 30 minutes  Signed:  TAT, DAVID, DO [facility] Pager: [phone] 09/21/2015, 11:38 AM

## 2015-09-21 NOTE — Progress Notes (Signed)
STROKE TEAM PROGRESS NOTE   SUBJECTIVE (INTERVAL HISTORY) His mother is at the bedside. He reports he is eating well. Patient without new complaints. Repeat CT head no changes and no hydrocephalus.   OBJECTIVE Temp:  [97.6 F (36.4 C)-98 F (36.7 C)] 98 F (36.7 C) (09/30 0911) Pulse Rate:  [75-85] 75 (09/29 2318) Cardiac Rhythm:  [-] Normal sinus rhythm (09/30 0717) Resp:  [12-22] 21 (09/30 0400) BP: (114-158)/(84-103) 114/85 mmHg (09/30 0400) SpO2:  [98 %-99 %] 99 % (09/30 0400)  CBC:   Recent Labs Lab 09/18/15 1117 09/18/15 1720 09/20/15 0244  WBC 5.8 7.1 8.1  NEUTROABS 4.6  --   --   HGB 11.2* 12.4* 11.7*  HCT 34.4* 36.2* 35.7*  MCV 93.0 91.0 91.8  PLT 186 215 [id number]    Basic Metabolic Panel:   Recent Labs Lab 09/18/15 1117 09/18/15 1720 09/20/15 0244  NA 143  --  140  K 3.9  --  4.0  CL 104  --  102  CO2 29  --  27  GLUCOSE 163*  --  109*  BUN 32*  --  26*  CREATININE 4.18* 4.08* 4.10*  CALCIUM 9.3  --  9.6    Lipid Panel:     Component Value Date/Time   CHOL 139 09/19/2015 0250   TRIG 105 09/19/2015 0250   HDL 40* 09/19/2015 0250   CHOLHDL 3.5 09/19/2015 0250   VLDL 21 09/19/2015 0250   LDLCALC 78 09/19/2015 0250   HgbA1c:  Lab Results  Component Value Date   HGBA1C 5.1 09/19/2015   Urine Drug Screen:     Component Value Date/Time   LABOPIA NONE DETECTED 09/19/2015 1216   COCAINSCRNUR NONE DETECTED 09/19/2015 1216   LABBENZ POSITIVE* 09/19/2015 1216   AMPHETMU NONE DETECTED 09/19/2015 1216   THCU NONE DETECTED 09/19/2015 1216   LABBARB NONE DETECTED 09/19/2015 1216      IMAGING  Dg Chest 2 View 09/18/2015    No active cardiopulmonary disease.     Ct Head Wo Contrast 09/21/2015 Recent left cerebellar infarct with stable mass effect. No hemorrhagic conversion or ventriculomegaly.  09/18/2015    1. Subacute nonhemorrhagic infarct in the left posterior inferior cerebellar arterial distribution. Possible lesser involvement in the  contralateral cerebellum.   Mr Brain Wo Contrast 09/18/2015    Large acute LEFT cerebellar infarcts affecting the inferior and superior cerebellum on that side. These involve the LEFT PICA and SCA territories. There is early left-to-right shift and effacement of the fourth ventricle. The patient is at risk for hydrocephalus. No evidence for hemorrhagic transformation at this time.  No visible occlusion of the vertebral arteries or basilar artery.   MR MRA Head / Brain without contrast 09/19/2015 Progressive edema in the left cerebellum most consistent with acute infarct. There is possible hemorrhagic transformation.  No significant intracranial stenosis.  2D echo - Left ventricle: The cavity size was normal. Systolic function wasnormal. The estimated ejection fraction was in the range of 55%to 60%. Although no diagnostic regional wall motion abnormalitywas identified, this possibility cannot be completely excluded onthe basis of this study. Left ventricular diastolic functionparameters were probably normal.  LE venous doppler No evidence of deep vein or superficial thrombosis involving the right lower extremity and left lower extremity.  Carotid Doppler  1-39% ICA plaquing. Vertebral artery flow is antegrade.    PHYSICAL EXAM General - Well nourished, well developed, in no apparent distress.  Ophthalmologic - Fundi not visualized due to noncooperation  Cardiovascular - Regular  rate and rhythm with no murmur.  Mental Status -  Level of arousal and orientation to time, place, and person were intact. Language including expression, naming, repetition, comprehension was assessed and found intact. Fund of Knowledge was assessed and was impaired.  As per mom, pt has MR and his cognitive capacity is at level of age 34-15.                                                                                                  . Cranial Nerves II - XII - II - Visual field intact OU. III, IV, VI -  Extraocular movements intact. V - Facial sensation intact bilaterally. VII - Facial movement intact bilaterally. VIII - Hearing & vestibular intact bilaterally. X - Palate elevates symmetrically. XI - Chin turning & shoulder shrug intact bilaterally. XII - Tongue protrusion intact.  Motor Strength - The patient's strength was normal in all extremities and pronator drift was absent.  Bulk was normal and fasciculations were absent.   Motor Tone - Muscle tone was assessed at the neck and appendages and was normal.  Reflexes - The patient's reflexes were 1+ in all extremities and he had no pathological reflexes.  Sensory - Light touch, temperature/pinprick were assessed and were symmetrical.    Coordination - The patient had left UE ataxia, LLE not able to test due to noncooperation. He has right hand tremor which has been for long time as per mom.   Gait and Station - not tested due to safety concerns.   ASSESSMENT/PLAN Mr. Kyle Valenzuela is a 54 y.o. male with history of CKD (refused for dialysis), MR, COPD, HTN, and schizophrenia presenting with difficulty walking, nausea and vomiting.  He did not receive IV t-PA due to late presentation.  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   Resultant  Left UE ataxia  MRI  Large acute LEFT cerebellar infarcts affecting the PICA and SCA terrotories  MRA  - unremarkable  Repeat CT neg for hydrocephalus  Carotid Doppler No significant stenosis   Lower extremity venous duplex negative  2D Echo unremarkable  Autoimmune and hypercoagulable labs - Sjogrens SSA/ENA 1.6 H, homocysteine 25.6 - rest are neg or pending  LDL - 78  HgbA1c 5.1  VTE prophylaxis - subq heparin Diet renal with fluid restriction Fluid restriction:: 1200 mL Fluid; Room service appropriate?: Yes; Fluid consistency:: Thin  no antithrombotic prior to admission, now on aspirin 325 mg orally every day.   Patient counseled to be  compliant with his antithrombotic medications  Dispo: return to group home with [facility] (if they allow) NOTHING FURTHER TO ADD FROM THE STROKE STANDPOINT Patient has a 10-15% risk of having another stroke over the next year, the highest risk is within 2 weeks of the most recent stroke/TIA (risk of having a stroke following a stroke or TIA is the same). Ongoing risk factor control by Primary Care Physician Discussed with sister also the POA, no aggressive work up such as TEE or loop recorder. We recommend 30 day cardiac event monitor as outpt.  Please set up with cardiology or cardiologist of his choice. POA agrees with the plan. Stroke Service will sign off. Please call should any needs arise. Follow-up Stroke Clinic at [facility] with Dr. Rosalin Hawking in 2 months, order placed.  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 Please call with questions. Pt will follow up with Dr. Erlinda Hong at [facility] in about 2 months. Thanks for the consult.  Rosalin Hawking, MD PhD Stroke Neurology 09/21/2015 11:45 AM    To contact Stroke Continuity provider, please refer to [email or web address] After hours, contact General Neurology

## 2015-09-25 ENCOUNTER — Ambulatory Visit (HOSPITAL_COMMUNITY): Payer: Self-pay | Admitting: Psychiatry

## 2015-09-27 ENCOUNTER — Encounter (HOSPITAL_COMMUNITY)
Admission: AD | Admit: 2015-09-27 | Discharge: 2015-09-27 | Disposition: A | Discharge status: Home / Self Care | Payer: Medicare Other | Source: Skilled Nursing Facility | Attending: Pulmonary Disease | Admitting: Pulmonary Disease

## 2015-09-27 LAB — BASIC METABOLIC PANEL
ANION GAP: 9 (ref 5–15)
BUN: 58 mg/dL — ABNORMAL HIGH (ref 6–20)
CALCIUM: 9.1 mg/dL (ref 8.9–10.3)
CO2: 29 mmol/L (ref 22–32)
CREATININE: 4.49 mg/dL — AB (ref 0.61–1.24)
Chloride: 105 mmol/L (ref 101–111)
GFR calc Af Amer: 16 mL/min — ABNORMAL LOW (ref >60)
GFR, EST NON AFRICAN AMERICAN: 13 mL/min — AB (ref >60)
GLUCOSE: 92 mg/dL (ref 65–99)
Potassium: 3.9 mmol/L (ref 3.5–5.1)
Sodium: 143 mmol/L (ref 135–145)

## 2015-10-01 ENCOUNTER — Telehealth: Payer: Self-pay

## 2015-10-01 NOTE — Telephone Encounter (Signed)
Spoke to male @ [phone]. He stated patient is at a different rehab center this is no longer his primary number. He said patient is at the [facility] in [place]. I removed the number. Spoke to mother, she said she had spoke to Dr. Erlinda Hong last week. She confirmed patient is resides at [facility] on the [address]. She did not have the number. Called the facility, asked to speak with his nurse b/c mother stated he would not be able to understand.

## 2015-10-01 NOTE — Telephone Encounter (Signed)
Rn call patients first number it was disconnected. Rn call patients sister Kyle Valenzuela at [phone] on his emergency contact. Kyle Valenzuela stated her brother is in a group home. Rn stated to notified her brother that his carotid doppler was unremarkable and continue the treatment plan. Kyle Valenzuela verbalized understanding and will tell her brother.

## 2015-10-01 NOTE — Telephone Encounter (Signed)
-----   Message from Rosalin Hawking, MD sent at 09/28/2015  1:22 PM EDT ----- Please let the patient note that his carotid Doppler done the other day in office was unremarkable. Continue current treatment plan. Thanks.  Rosalin Hawking, MD PhD Stroke Neurology 09/28/2015 1:22 PM

## 2015-10-01 NOTE — Telephone Encounter (Signed)
Receive a incoming call from Jeronimo Greaves who gave the number of the [facility] in [place] at [phone]. He is the owner of the group that the patient was previously at. He stated pt is now at the [facility].

## 2015-10-04 ENCOUNTER — Ambulatory Visit (HOSPITAL_COMMUNITY)
Admission: RE | Admit: 2015-10-04 | Discharge: 2015-10-04 | Disposition: A | Discharge status: Home / Self Care | Payer: No Typology Code available for payment source | Source: Ambulatory Visit | Attending: Pulmonary Disease | Admitting: Pulmonary Disease

## 2015-10-04 ENCOUNTER — Other Ambulatory Visit (HOSPITAL_COMMUNITY): Payer: Self-pay | Admitting: Pulmonary Disease

## 2015-10-04 DIAGNOSIS — R05 Cough: Secondary | ICD-10-CM | POA: Diagnosis present

## 2015-10-04 DIAGNOSIS — R059 Cough, unspecified: Secondary | ICD-10-CM

## 2015-10-05 ENCOUNTER — Encounter (HOSPITAL_COMMUNITY)
Admission: RE | Admit: 2015-10-05 | Discharge: 2015-10-05 | Disposition: A | Discharge status: Home / Self Care | Payer: Medicare Other | Source: Skilled Nursing Facility | Attending: Pulmonary Disease | Admitting: Pulmonary Disease

## 2015-10-05 LAB — CBC WITH DIFFERENTIAL/PLATELET
BASOS PCT: 0 %
Basophils Absolute: 0 10*3/uL (ref 0.0–0.1)
EOS ABS: 0.2 10*3/uL (ref 0.0–0.7)
Eosinophils Relative: 4 %
HEMATOCRIT: 34.7 % — AB (ref 39.0–52.0)
Hemoglobin: 11.2 g/dL — ABNORMAL LOW (ref 13.0–17.0)
Lymphocytes Relative: 28 %
Lymphs Abs: 1.6 10*3/uL (ref 0.7–4.0)
MCH: 30.4 pg (ref 26.0–34.0)
MCHC: 32.3 g/dL (ref 30.0–36.0)
MCV: 94.3 fL (ref 78.0–100.0)
MONO ABS: 0.4 10*3/uL (ref 0.1–1.0)
Monocytes Relative: 7 %
NEUTROS ABS: 3.4 10*3/uL (ref 1.7–7.7)
NEUTROS PCT: 61 %
Platelets: 187 10*3/uL (ref 150–400)
RBC: 3.68 MIL/uL — ABNORMAL LOW (ref 4.22–5.81)
RDW: 13.2 % (ref 11.5–15.5)
WBC: 5.6 10*3/uL (ref 4.0–10.5)

## 2015-10-05 LAB — BASIC METABOLIC PANEL
ANION GAP: 9 (ref 5–15)
BUN: 50 mg/dL — ABNORMAL HIGH (ref 6–20)
CALCIUM: 9.3 mg/dL (ref 8.9–10.3)
CO2: 31 mmol/L (ref 22–32)
CREATININE: 4.49 mg/dL — AB (ref 0.61–1.24)
Chloride: 107 mmol/L (ref 101–111)
GFR, EST AFRICAN AMERICAN: 16 mL/min — AB (ref >60)
GFR, EST NON AFRICAN AMERICAN: 13 mL/min — AB (ref >60)
GLUCOSE: 90 mg/dL (ref 65–99)
Potassium: 4.5 mmol/L (ref 3.5–5.1)
Sodium: 147 mmol/L — ABNORMAL HIGH (ref 135–145)

## 2015-11-05 ENCOUNTER — Other Ambulatory Visit (HOSPITAL_COMMUNITY): Payer: Self-pay

## 2015-11-05 ENCOUNTER — Ambulatory Visit (HOSPITAL_COMMUNITY): Payer: Medicare Other | Admitting: Speech Pathology

## 2015-11-07 ENCOUNTER — Ambulatory Visit: Payer: Self-pay | Admitting: Neurology

## 2015-11-08 ENCOUNTER — Encounter: Payer: Self-pay | Admitting: Neurology

## 2016-08-04 ENCOUNTER — Emergency Department (HOSPITAL_COMMUNITY)
Admission: EM | Admit: 2016-08-04 | Discharge: 2016-08-04 | Disposition: A | Discharge status: Home / Self Care | Payer: Medicare Other | Attending: Emergency Medicine | Admitting: Emergency Medicine

## 2016-08-04 ENCOUNTER — Encounter (HOSPITAL_COMMUNITY): Payer: Self-pay | Admitting: Emergency Medicine

## 2016-08-04 DIAGNOSIS — F17210 Nicotine dependence, cigarettes, uncomplicated: Secondary | ICD-10-CM | POA: Diagnosis not present

## 2016-08-04 DIAGNOSIS — Z79899 Other long term (current) drug therapy: Secondary | ICD-10-CM | POA: Diagnosis not present

## 2016-08-04 DIAGNOSIS — R451 Restlessness and agitation: Secondary | ICD-10-CM | POA: Diagnosis present

## 2016-08-04 DIAGNOSIS — Z7982 Long term (current) use of aspirin: Secondary | ICD-10-CM | POA: Diagnosis not present

## 2016-08-04 DIAGNOSIS — N186 End stage renal disease: Secondary | ICD-10-CM | POA: Insufficient documentation

## 2016-08-04 DIAGNOSIS — J449 Chronic obstructive pulmonary disease, unspecified: Secondary | ICD-10-CM | POA: Insufficient documentation

## 2016-08-04 DIAGNOSIS — I120 Hypertensive chronic kidney disease with stage 5 chronic kidney disease or end stage renal disease: Secondary | ICD-10-CM | POA: Insufficient documentation

## 2016-08-04 DIAGNOSIS — F419 Anxiety disorder, unspecified: Secondary | ICD-10-CM | POA: Insufficient documentation

## 2016-08-04 DIAGNOSIS — Z8673 Personal history of transient ischemic attack (TIA), and cerebral infarction without residual deficits: Secondary | ICD-10-CM | POA: Insufficient documentation

## 2016-08-04 LAB — COMPREHENSIVE METABOLIC PANEL
ALBUMIN: 4.5 g/dL (ref 3.5–5.0)
ALK PHOS: 77 U/L (ref 38–126)
ALT: 15 U/L — ABNORMAL LOW (ref 17–63)
ANION GAP: 6 (ref 5–15)
AST: 19 U/L (ref 15–41)
BUN: 43 mg/dL — ABNORMAL HIGH (ref 6–20)
CHLORIDE: 100 mmol/L — AB (ref 101–111)
CO2: 26 mmol/L (ref 22–32)
Calcium: 8.6 mg/dL — ABNORMAL LOW (ref 8.9–10.3)
Creatinine, Ser: 4.77 mg/dL — ABNORMAL HIGH (ref 0.61–1.24)
GFR calc Af Amer: 14 mL/min — ABNORMAL LOW (ref >60)
GFR calc non Af Amer: 12 mL/min — ABNORMAL LOW (ref >60)
GLUCOSE: 83 mg/dL (ref 65–99)
POTASSIUM: 4.9 mmol/L (ref 3.5–5.1)
SODIUM: 132 mmol/L — AB (ref 135–145)
Total Bilirubin: 0.5 mg/dL (ref 0.3–1.2)
Total Protein: 7.5 g/dL (ref 6.5–8.1)

## 2016-08-04 LAB — ETHANOL: Alcohol, Ethyl (B): <5 mg/dL (ref <5)

## 2016-08-04 LAB — RAPID URINE DRUG SCREEN, HOSP PERFORMED
AMPHETAMINES: NOT DETECTED
BENZODIAZEPINES: POSITIVE — AB
Barbiturates: NOT DETECTED
Cocaine: NOT DETECTED
Opiates: NOT DETECTED
TETRAHYDROCANNABINOL: NOT DETECTED

## 2016-08-04 LAB — CBC
HEMATOCRIT: 38.2 % — AB (ref 39.0–52.0)
HEMOGLOBIN: 12.8 g/dL — AB (ref 13.0–17.0)
MCH: 30.8 pg (ref 26.0–34.0)
MCHC: 33.5 g/dL (ref 30.0–36.0)
MCV: 92 fL (ref 78.0–100.0)
Platelets: 156 10*3/uL (ref 150–400)
RBC: 4.15 MIL/uL — AB (ref 4.22–5.81)
RDW: 13.1 % (ref 11.5–15.5)
WBC: 8.2 10*3/uL (ref 4.0–10.5)

## 2016-08-04 NOTE — BH Assessment (Addendum)
Tele Assessment Note   Kyle Valenzuela is an 55 y.o. male who presents voluntarily accompanied by [facility] rest home reporting symptoms of agitation and aggression, threatening to "make a resident give him her cigarettes", and "running down the halls yelling loudly" per Tommy Medal, the supervisor in charge. She states that client woke up at 3am and began exhibiting this behavior, and pt's PRN medications given were not effective. She states that "he gets like this sometimes and we send him to the hospital and they give him some medication and he comes back".   Pt has a history of schizophrenia per chart, and gets his medication from his PCP per [facility] home staff. Pt  And gp home staff report compliance with medication.  Pt denies  current suicidal ideation or plans and past attempts.  Pt acknowledges symptoms including: "I just got a little upset", which he repeats several times. He states, "I just missed my mom and sister and started crying, but I won't do that again". PT denies homicidal ideation/ history of violence. Pt denies auditory or visual hallucinations or other psychotic symptoms. Pt denies current stressors.   Pt's supports include his mother and his sister. Pt denies history of abuse or trauma. Pt has poor insight and judgment. Pt's memory is poor. Pt denies legal history includes ?  Pt denies alcohol/ substance abuse. ? MSE: Pt is casually dressed, alert, oriented x4 with normal speech and motor behavior exhibiting tremors. Eye contact is good. Pt's mood is anxious, appropriate to situation, affect is congruent with mood. Thought process is coherent and relevant. There is no indication Pt is currently responding to internal stimuli or experiencing delusional thought content. Pt was cooperative throughout assessment.   Elmarie Shiley, NP recommends discharge back to gp home, OP treatment.   Diagnosis: Mood Disorder NOS  Past Medical History:  Past Medical History:  Diagnosis Date  .  Chronic kidney disease   . COPD (chronic obstructive pulmonary disease) ([place])   . Hyperglycemia   . Hypertension   . Polydipsia   . Schizophrenia [facility])     Past Surgical History:  Procedure Laterality Date  . AV FISTULA PLACEMENT  04/22/2012   Procedure: ARTERIOVENOUS (AV) FISTULA CREATION;  Surgeon: Serafina Mitchell, MD;  Location: [facility];  Service: Vascular;  Laterality: Right;  . AV FISTULA PLACEMENT  07/30/12   Right Ligation of competing branches  AVF  . MULTIPLE TOOTH EXTRACTIONS    . TONSILLECTOMY     as a child    Family History: History reviewed. No pertinent family history.  Social History:  reports that he has been smoking Cigarettes.  He has been smoking about 0.50 packs per day. He has never used smokeless tobacco. He reports that he does not drink alcohol or use drugs.  Additional Social History:  Alcohol / Drug Use Pain Medications: pt denies Prescriptions: pt denies Over the Counter: pt denies History of alcohol / drug use?: No history of alcohol / drug abuse Longest period of sobriety (when/how long): N/A Negative Consequences of Use:  (denies) Withdrawal Symptoms:  (denies)  CIWA: CIWA-Ar BP: 157/82 Pulse Rate: 74 COWS:    PATIENT STRENGTHS: (choose at least two) Communication skills Supportive family/friends  Allergies: No Known Allergies  Home Medications:  (Not in a hospital admission)  OB/GYN Status:  No LMP for male patient.  General Assessment Data Location of Assessment: [facility] Services TTS Assessment: In system Is this a Tele or Face-to-Face Assessment?: Tele Assessment Is this an  Initial Assessment or a Re-assessment for this encounter?: Initial Assessment Marital status: Single Living Arrangements: Group Home Can pt return to current living arrangement?: Yes Admission Status: Voluntary Is patient capable of signing voluntary admission?: Yes Referral Source:  ([facility]) Insurance type: [facility]     Crisis Care Plan Living  Arrangements: Group Home Name of Psychiatrist:  (none) Name of Therapist: none  Education Status Is patient currently in school?: No  Risk to self with the past 6 months Suicidal Ideation: No Has patient been a risk to self within the past 6 months prior to admission? : No Suicidal Intent: No Has patient had any suicidal intent within the past 6 months prior to admission? : No Is patient at risk for suicide?: No Suicidal Plan?: No Has patient had any suicidal plan within the past 6 months prior to admission? : No Access to Means: No Previous Attempts/Gestures: No Intentional Self Injurious Behavior: None Family Suicide History: Unknown Recent stressful life event(s):  (none known) Persecutory voices/beliefs?: No Depression: No Depression Symptoms: Feeling angry/irritable Substance abuse history and/or treatment for substance abuse?: No Suicide prevention information given to non-admitted patients: Not applicable  Risk to Others within the past 6 months Homicidal Ideation: No Does patient have any lifetime risk of violence toward others beyond the six months prior to admission? : No Thoughts of Harm to Others: No Current Homicidal Intent: No Current Homicidal Plan: No Access to Homicidal Means: No History of harm to others?: Yes Assessment of Violence: In distant past Violent Behavior Description: some aggression Does patient have access to weapons?: No Criminal Charges Pending?: No Does patient have a court date: No Is patient on probation?: No  Psychosis Hallucinations: None noted Delusions: None noted  Mental Status Report Appearance/Hygiene: Unremarkable Eye Contact: Good Motor Activity: Tremors Speech: Logical/coherent Level of Consciousness: Alert Mood: Anxious Affect: Anxious Anxiety Level: Moderate Thought Processes: Coherent, Relevant Judgement: Impaired Orientation: Person, Place, Time, Situation, Appropriate for developmental age Obsessive Compulsive  Thoughts/Behaviors: None  Cognitive Functioning Concentration: Fair Memory: Recent Impaired, Remote Impaired IQ: Average Insight: Poor Impulse Control: Poor Appetite: Fair Weight Loss: 0 Weight Gain: 0 Sleep: No Change Total Hours of Sleep: 8 Vegetative Symptoms: None  ADLScreening [facility] Assessment Services) Patient's cognitive ability adequate to safely complete daily activities?: Yes Patient able to express need for assistance with ADLs?: Yes Independently performs ADLs?: Yes (appropriate for developmental age)  Prior Inpatient Therapy Prior Inpatient Therapy: No  Prior Outpatient Therapy Prior Outpatient Therapy: Yes (PCP) Prior Therapy Dates:  (unk) Prior Therapy Facilty/Provider(s):  (PCP) Reason for Treatment:  (hx of mental illness) Does patient have an ACCT team?: No Does patient have Intensive In-House Services?  : No Does patient have [facility] services? : No Does patient have P4CC services?: No  ADL Screening (condition at time of admission) Patient's cognitive ability adequate to safely complete daily activities?: Yes Is the patient deaf or have difficulty hearing?: Yes Does the patient have difficulty seeing, even when wearing glasses/contacts?: No Does the patient have difficulty concentrating, remembering, or making decisions?: Yes Patient able to express need for assistance with ADLs?: Yes Does the patient have difficulty dressing or bathing?: Yes Independently performs ADLs?: Yes (appropriate for developmental age) Weakness of Legs: None Weakness of Arms/Hands: None  Home Assistive Devices/Equipment Home Assistive Devices/Equipment:  (none known)  Therapy Consults (therapy consults require a physician order) PT Evaluation Needed: No OT Evalulation Needed: No SLP Evaluation Needed: No Abuse/Neglect Assessment (Assessment to be complete while patient is alone)  Physical Abuse: Denies Verbal Abuse: Denies Sexual Abuse: Denies Exploitation of  patient/patient's resources: Denies Self-Neglect: Denies Possible abuse reported to:: [place] / Beliefs Cultural Requests During Hospitalization: None Spiritual Requests During Hospitalization: None   Advance Directives (For Healthcare) Does patient have an advance directive?: No Would patient like information on creating an advanced directive?: No - patient declined information    Additional Information 1:1 In Past 12 Months?: No CIRT Risk: Yes Elopement Risk: Yes Does patient have medical clearance?: No     Disposition:  Disposition Initial Assessment Completed for this Encounter: Yes Disposition of Patient: Outpatient treatment Type of outpatient treatment: Adult  Saranda Legrande [facility] 08/04/2016 3:42 PM

## 2016-08-04 NOTE — ED Triage Notes (Signed)
PT brought in today by EMS from [facility] with paperwork stating "Kyle Valenzuela has been up since 0400 today and has been given lorazepam, vistaril, alprazolam and still hasn't calmed down. He has been very aggressive all morning." PT states I feel agitated today and my medications need to be adjusted. PT denies any SI/HI and cooperative in triage and states "I hope I can go back home today."

## 2016-08-04 NOTE — ED Notes (Signed)
Monitor Placed in the rm by [facility] for TTS consult.

## 2016-08-04 NOTE — Progress Notes (Signed)
Spoke with Dr. Eddie Dibbles regarding medication recommendation for patient due to recent episode of agitation at his assisted living center. Would recommend a 5 mg daily prn dose of zyprexa zydis in the future. Do not recommend administering two benzodiazepines (ativan/xanax) at the same time as reported was done. Patient is reported to be receiving 15 mg daily total of zyprexa with the daily max being 20 mg. His kidney function is compromised with current creatinine level at 4.77 but zyprexa does not require a renal adjustment. Also would recommend that patient establish care with a Psychiatrist if not already in place at the assisted living due to his chronic mental illness and serious medical issues.

## 2016-08-04 NOTE — Discharge Instructions (Signed)
The patient can have 5mg  extra daily of zyprexa if he continues to have bad anxiety.  He already takes 15mg  daily and this would increase him to 20mg  a day when he needs it for anxiety

## 2016-08-05 NOTE — ED Provider Notes (Signed)
[facility] DEPT Provider Note   CSN: [id number] Arrival date & time: 08/04/16  1201     History   Chief Complaint Chief Complaint  Patient presents with  . Agitation    HPI Kyle Valenzuela is a 55 y.o. male.  Pt sent from group home for anxiety   The history is provided by the nursing home. No language interpreter was used.  Altered Mental Status   This is a recurrent problem. The current episode started 12 to 24 hours ago. The problem has not changed since onset.Pertinent negatives include no seizures, no unresponsiveness and no hallucinations. Risk factors: nothing. His past medical history does not include seizures.    Past Medical History:  Diagnosis Date  . Chronic kidney disease   . COPD (chronic obstructive pulmonary disease) ([place])   . Hyperglycemia   . Hypertension   . Polydipsia   . Schizophrenia [place])     Patient Active Problem List   Diagnosis Date Noted  . 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 Chronic kidney disease (CKD), stage IV (severe) ([place]) 09/19/2015  . Essential hypertension 09/19/2015  . Gait instability 09/19/2015  . Cerebellar stroke, acute ([place])   . HLD (hyperlipidemia)   . Mental retardation   . Acute CVA (cerebrovascular accident) ([place]) 09/18/2015  . Fall 09/18/2015  . HTN (hypertension) 09/18/2015  . Stroke ([place]) 09/18/2015  . 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 Psychogenic polydipsia   . Schizophrenia, unspecified type ([place])   . End stage renal disease ([place]) 03/01/2012    Past Surgical History:  Procedure Laterality Date  . AV FISTULA PLACEMENT  04/22/2012   Procedure: ARTERIOVENOUS (AV) FISTULA CREATION;  Surgeon: Serafina Mitchell, MD;  Location: [facility];  Service: Vascular;  Laterality: Right;  . AV FISTULA PLACEMENT  07/30/12   Right Ligation of competing branches  AVF  . MULTIPLE TOOTH EXTRACTIONS    . TONSILLECTOMY     as a child       Home Medications    Prior to Admission medications     Medication Sig Start Date End Date Taking? Authorizing Provider  albuterol (PROVENTIL HFA;VENTOLIN HFA) 108 (90 BASE) MCG/ACT inhaler Inhale 2 puffs into the lungs every 4 (four) hours as needed. For shortness of breath   Yes Historical Provider, MD  ALPRAZolam Duanne Moron) 1 MG tablet Take 1 mg by mouth 4 (four) times daily.    Yes Historical Provider, MD  amLODipine (NORVASC) 2.5 MG tablet Take 1 tablet (2.5 mg total) by mouth daily. 09/21/15  Yes Orson Eva, MD  aspirin 325 MG tablet Take 1 tablet (325 mg total) by mouth daily. 09/21/15  Yes Orson Eva, MD  atorvastatin (LIPITOR) 10 MG tablet Take 1 tablet (10 mg total) by mouth daily at 6 PM. 09/21/15  Yes Orson Eva, MD  carvedilol (COREG) 6.25 MG tablet Take 6.25 mg by mouth 2 (two) times daily with a meal.   Yes Historical Provider, MD  divalproex (DEPAKOTE) 500 MG DR tablet Take 500-1,000 mg by mouth 2 (two) times daily. 500 mg in the morning and 1000 mg at bedtime.   Yes Historical Provider, MD  hydrOXYzine (ATARAX/VISTARIL) 25 MG tablet Take 25 mg by mouth 2 (two) times daily.   Yes Historical Provider, MD  hydrOXYzine (VISTARIL) 50 MG capsule Take 50 mg by mouth 3 (three) times daily as needed for anxiety.   Yes Historical Provider, MD  iron polysaccharides (NIFEREX) 150 MG capsule Take 150 mg by mouth daily.   Yes  Historical Provider, MD  LORazepam (ATIVAN) 0.5 MG tablet Take 0.5 mg by mouth 2 (two) times daily as needed for anxiety.   Yes Historical Provider, MD  OLANZapine (ZYPREXA) 10 MG tablet Take 10 mg by mouth at bedtime.    Yes Historical Provider, MD  OLANZapine (ZYPREXA) 5 MG tablet Take 5 mg by mouth every morning.   Yes Historical Provider, MD  polyethylene glycol powder (GLYCOLAX/MIRALAX) powder Take 17 g by mouth daily.   Yes Historical Provider, MD  sodium bicarbonate 650 MG tablet Take 650 mg by mouth 2 (two) times daily.   Yes Historical Provider, MD  sodium chloride (OCEAN) 0.65 % SOLN nasal spray Place 1 spray into both nostrils  as needed for congestion.   Yes Historical Provider, MD  traZODone (DESYREL) 150 MG tablet Take 150 mg by mouth at bedtime.   Yes Historical Provider, MD  zolpidem (AMBIEN) 10 MG tablet Take 10 mg by mouth at bedtime as needed for sleep.   Yes Historical Provider, MD    Family History History reviewed. No pertinent family history.  Social History Social History  Substance Use Topics  . Smoking status: Current Every Day Smoker    Packs/day: 0.50    Types: Cigarettes  . Smokeless tobacco: Never Used     Comment: 7 cigs per day  . Alcohol use No     Allergies   Review of patient's allergies indicates no known allergies.   Review of Systems Review of Systems  Constitutional: Negative for appetite change and fatigue.  HENT: Negative for congestion, ear discharge and sinus pressure.   Eyes: Negative for discharge.  Respiratory: Negative for cough.   Cardiovascular: Negative for chest pain.  Gastrointestinal: Negative for abdominal pain and diarrhea.  Genitourinary: Negative for frequency and hematuria.  Musculoskeletal: Negative for back pain.  Skin: Negative for rash.  Neurological: Negative for seizures and headaches.  Psychiatric/Behavioral: Negative for hallucinations.     Physical Exam Updated Vital Signs BP 171/88 (BP Location: Left Arm)   Pulse 69   Temp 97.5 F (36.4 C) (Oral)   Resp 20   Ht 5\' 8"  (1.727 m)   Wt 160 lb (72.6 kg)   SpO2 100%   BMI 24.33 kg/m   Physical Exam  Constitutional: He is oriented to person, place, and time. He appears well-developed.  HENT:  Head: Normocephalic.  Eyes: Conjunctivae and EOM are normal. No scleral icterus.  Neck: Neck supple. No tracheal deviation present. No thyromegaly present.  Cardiovascular: Normal rate and regular rhythm.  Exam reveals no gallop and no friction rub.   No murmur heard. Pulmonary/Chest: No stridor. He has no wheezes. He has no rales. He exhibits no tenderness.  Abdominal: He exhibits no  distension. There is no tenderness. There is no rebound.  Musculoskeletal: Normal range of motion. He exhibits no edema.  Lymphadenopathy:    He has no cervical adenopathy.  Neurological: He is oriented to person, place, and time. He exhibits normal muscle tone. Coordination normal.  Skin: Skin is warm. No rash noted. No erythema.  Psychiatric:  Patient not suicidal or homicidal. Patient with moderate anxiety     ED Treatments / Results  Labs (all labs ordered are listed, but only abnormal results are displayed) Labs Reviewed  COMPREHENSIVE METABOLIC PANEL - Abnormal; Notable for the following:       Result Value   Sodium 132 (*)    Chloride 100 (*)    BUN 43 (*)    Creatinine, Ser 4.77 (*)  Calcium 8.6 (*)    ALT 15 (*)    GFR calc non Af Amer 12 (*)    GFR calc Af Amer 14 (*)    All other components within normal limits  CBC - Abnormal; Notable for the following:    RBC 4.15 (*)    Hemoglobin 12.8 (*)    HCT 38.2 (*)    All other components within normal limits  URINE RAPID DRUG SCREEN, HOSP PERFORMED - Abnormal; Notable for the following:    Benzodiazepines POSITIVE (*)    All other components within normal limits  ETHANOL    EKG  EKG Interpretation None       Radiology No results found.  Procedures Procedures (including critical care time)  Medications Ordered in ED Medications - No data to display   Initial Impression / Assessment and Plan / ED Course  I have reviewed the triage vital signs and the nursing notes.  Pertinent labs & imaging results that were available during my care of the patient were reviewed by me and considered in my medical decision making (see chart for details).  Clinical Course    Patient with schizophrenia and moderate anxiety. Labs unremarkable. Patient was seen by psychiatry and they recommended increasing his Zyprexa  Final Clinical Impressions(s) / ED Diagnoses   Final diagnoses:  Anxiety    New  Prescriptions Discharge Medication List as of 08/04/2016  6:09 PM       Milton Ferguson, MD 08/05/16 1216

## 2019-09-16 ENCOUNTER — Encounter (HOSPITAL_COMMUNITY): Payer: Self-pay

## 2019-09-16 ENCOUNTER — Inpatient Hospital Stay (HOSPITAL_COMMUNITY)
Admission: EM | Admit: 2019-09-16 | Discharge: 2019-09-20 | DRG: 699 | Disposition: A | Discharge status: Hospice | Payer: Medicare Other | Attending: Pulmonary Disease | Admitting: Pulmonary Disease

## 2019-09-16 ENCOUNTER — Other Ambulatory Visit: Payer: Self-pay

## 2019-09-16 DIAGNOSIS — I120 Hypertensive chronic kidney disease with stage 5 chronic kidney disease or end stage renal disease: Secondary | ICD-10-CM | POA: Diagnosis present

## 2019-09-16 DIAGNOSIS — E1122 Type 2 diabetes mellitus with diabetic chronic kidney disease: Secondary | ICD-10-CM | POA: Diagnosis not present

## 2019-09-16 DIAGNOSIS — N189 Chronic kidney disease, unspecified: Secondary | ICD-10-CM | POA: Diagnosis present

## 2019-09-16 DIAGNOSIS — Z66 Do not resuscitate: Secondary | ICD-10-CM | POA: Diagnosis present

## 2019-09-16 DIAGNOSIS — J449 Chronic obstructive pulmonary disease, unspecified: Secondary | ICD-10-CM | POA: Diagnosis present

## 2019-09-16 DIAGNOSIS — N289 Disorder of kidney and ureter, unspecified: Secondary | ICD-10-CM

## 2019-09-16 DIAGNOSIS — F79 Unspecified intellectual disabilities: Secondary | ICD-10-CM | POA: Diagnosis present

## 2019-09-16 DIAGNOSIS — F17210 Nicotine dependence, cigarettes, uncomplicated: Secondary | ICD-10-CM | POA: Diagnosis present

## 2019-09-16 DIAGNOSIS — F209 Schizophrenia, unspecified: Secondary | ICD-10-CM | POA: Diagnosis present

## 2019-09-16 DIAGNOSIS — Z515 Encounter for palliative care: Secondary | ICD-10-CM

## 2019-09-16 DIAGNOSIS — Z20828 Contact with and (suspected) exposure to other viral communicable diseases: Secondary | ICD-10-CM | POA: Diagnosis present

## 2019-09-16 DIAGNOSIS — Z79899 Other long term (current) drug therapy: Secondary | ICD-10-CM

## 2019-09-16 DIAGNOSIS — N179 Acute kidney failure, unspecified: Secondary | ICD-10-CM | POA: Diagnosis present

## 2019-09-16 DIAGNOSIS — R4689 Other symptoms and signs involving appearance and behavior: Secondary | ICD-10-CM

## 2019-09-16 DIAGNOSIS — Z7982 Long term (current) use of aspirin: Secondary | ICD-10-CM

## 2019-09-16 DIAGNOSIS — R631 Polydipsia: Secondary | ICD-10-CM | POA: Diagnosis present

## 2019-09-16 DIAGNOSIS — N185 Chronic kidney disease, stage 5: Secondary | ICD-10-CM | POA: Diagnosis present

## 2019-09-16 DIAGNOSIS — Z9089 Acquired absence of other organs: Secondary | ICD-10-CM

## 2019-09-16 DIAGNOSIS — Z5329 Procedure and treatment not carried out because of patient's decision for other reasons: Secondary | ICD-10-CM | POA: Diagnosis present

## 2019-09-16 DIAGNOSIS — Z7189 Other specified counseling: Secondary | ICD-10-CM

## 2019-09-16 DIAGNOSIS — R456 Violent behavior: Secondary | ICD-10-CM | POA: Diagnosis not present

## 2019-09-16 DIAGNOSIS — E785 Hyperlipidemia, unspecified: Secondary | ICD-10-CM | POA: Diagnosis present

## 2019-09-16 DIAGNOSIS — Z8673 Personal history of transient ischemic attack (TIA), and cerebral infarction without residual deficits: Secondary | ICD-10-CM

## 2019-09-16 NOTE — ED Triage Notes (Signed)
Pt was being arrested for a warrant, stays at [facility].  Pt was taken to the jail and the nurse upon her evaluation declined him at the jail due to his behavior

## 2019-09-16 NOTE — ED Notes (Signed)
Received a call back from the owner of [facility]) who stated that pt was not allowed to come back and that if we sent him back then she "would take out another IVC on him". Dr Dina Rich made aware of this and plan is for social work consult to be ordered to assist with placement. Pt made aware of this and verbalizes understanding.
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
## 2019-09-17 NOTE — ED Notes (Signed)
Pt given meal to eat. States that he did not receive dinner at group home today.
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
## 2019-09-17 NOTE — ED Notes (Signed)
Out of bed to bathroom 
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
## 2019-09-17 NOTE — ED Notes (Signed)
Meal provided 

## 2019-09-17 NOTE — ED Notes (Signed)
Continues sleeping

## 2019-09-17 NOTE — ED Notes (Signed)
SW on call sent text requesting number on IVC forms which has been provided

## 2019-09-17 NOTE — ED Notes (Signed)
Out of bed to BR 

## 2019-09-17 NOTE — ED Notes (Signed)
Spoke with Junie Panning (Education officer, museum) regarding returning back to [facility].  Erin to spoke with facility and called if pt can return, if not it will be Monday before search for placement can be started.

## 2019-09-18 DIAGNOSIS — N179 Acute kidney failure, unspecified: Secondary | ICD-10-CM | POA: Diagnosis not present

## 2019-09-18 DIAGNOSIS — N185 Chronic kidney disease, stage 5: Secondary | ICD-10-CM

## 2019-09-18 DIAGNOSIS — N189 Chronic kidney disease, unspecified: Secondary | ICD-10-CM | POA: Diagnosis present

## 2019-09-18 LAB — CBC WITH DIFFERENTIAL/PLATELET
Abs Immature Granulocytes: 0.04 10*3/uL (ref 0.00–0.07)
Basophils Absolute: 0 10*3/uL (ref 0.0–0.1)
Basophils Relative: 0 %
Eosinophils Absolute: 0 10*3/uL (ref 0.0–0.5)
Eosinophils Relative: 0 %
HCT: 38.7 % — ABNORMAL LOW (ref 39.0–52.0)
Hemoglobin: 12.2 g/dL — ABNORMAL LOW (ref 13.0–17.0)
Immature Granulocytes: 1 %
Lymphocytes Relative: 14 %
Lymphs Abs: 0.8 10*3/uL (ref 0.7–4.0)
MCH: 31.2 pg (ref 26.0–34.0)
MCHC: 31.5 g/dL (ref 30.0–36.0)
MCV: 99 fL (ref 80.0–100.0)
Monocytes Absolute: 0.6 10*3/uL (ref 0.1–1.0)
Monocytes Relative: 10 %
Neutro Abs: 4.4 10*3/uL (ref 1.7–7.7)
Neutrophils Relative %: 75 %
Platelets: 157 10*3/uL (ref 150–400)
RBC: 3.91 MIL/uL — ABNORMAL LOW (ref 4.22–5.81)
RDW: 14.1 % (ref 11.5–15.5)
WBC: 5.8 10*3/uL (ref 4.0–10.5)
nRBC: 0 % (ref 0.0–0.2)

## 2019-09-18 LAB — COMPREHENSIVE METABOLIC PANEL
ALT: 20 U/L (ref 0–44)
AST: 23 U/L (ref 15–41)
Albumin: 4.1 g/dL (ref 3.5–5.0)
Alkaline Phosphatase: 76 U/L (ref 38–126)
Anion gap: 13 (ref 5–15)
BUN: 73 mg/dL — ABNORMAL HIGH (ref 6–20)
CO2: 22 mmol/L (ref 22–32)
Calcium: 9.8 mg/dL (ref 8.9–10.3)
Chloride: 107 mmol/L (ref 98–111)
Creatinine, Ser: 7.73 mg/dL — ABNORMAL HIGH (ref 0.61–1.24)
GFR calc Af Amer: 8 mL/min — ABNORMAL LOW (ref >60)
GFR calc non Af Amer: 7 mL/min — ABNORMAL LOW (ref >60)
Glucose, Bld: 135 mg/dL — ABNORMAL HIGH (ref 70–99)
Potassium: 4.7 mmol/L (ref 3.5–5.1)
Sodium: 142 mmol/L (ref 135–145)
Total Bilirubin: 0.6 mg/dL (ref 0.3–1.2)
Total Protein: 7.4 g/dL (ref 6.5–8.1)

## 2019-09-18 LAB — URINALYSIS, ROUTINE W REFLEX MICROSCOPIC
Bacteria, UA: NONE SEEN
Bilirubin Urine: NEGATIVE
Glucose, UA: 150 mg/dL — AB
Ketones, ur: NEGATIVE mg/dL
Leukocytes,Ua: NEGATIVE
Nitrite: NEGATIVE
Protein, ur: 100 mg/dL — AB
Specific Gravity, Urine: 1.008 (ref 1.005–1.030)
pH: 7 (ref 5.0–8.0)

## 2019-09-18 LAB — CK: Total CK: 315 U/L (ref 49–397)

## 2019-09-18 LAB — CBG MONITORING, ED: Glucose-Capillary: 119 mg/dL — ABNORMAL HIGH (ref 70–99)

## 2019-09-18 LAB — LIPASE, BLOOD: Lipase: 31 U/L (ref 11–51)

## 2019-09-18 LAB — SARS CORONAVIRUS 2 BY RT PCR (HOSPITAL ORDER, PERFORMED IN ~~LOC~~ HOSPITAL LAB): SARS Coronavirus 2: NEGATIVE

## 2019-09-18 LAB — GLUCOSE, CAPILLARY: Glucose-Capillary: 135 mg/dL — ABNORMAL HIGH (ref 70–99)

## 2019-09-18 MED ORDER — SODIUM CHLORIDE 0.9 % IV BOLUS: 1000.0000 mL | Freq: Once | INTRAVENOUS | Status: DC

## 2019-09-18 MED ORDER — OLANZAPINE 5 MG PO TABS
5.0000 mg | ORAL_TABLET | Freq: Every day | ORAL | Status: DC
  Administered 2019-09-18 – 2019-09-20 (×3): 5 mg via ORAL
  Filled 2019-09-18 (×3): qty 1

## 2019-09-18 MED ORDER — POLYSACCHARIDE IRON COMPLEX 150 MG PO CAPS
150.0000 mg | ORAL_CAPSULE | Freq: Every day | ORAL | Status: DC
  Administered 2019-09-18: 150 mg via ORAL
  Filled 2019-09-18 (×2): qty 1

## 2019-09-18 MED ORDER — ONDANSETRON HCL 4 MG/2ML IJ SOLN: 4.0000 mg | Freq: Four times a day (QID) | INTRAMUSCULAR | Status: DC | PRN

## 2019-09-18 MED ORDER — AMLODIPINE BESYLATE 5 MG PO TABS
2.5000 mg | ORAL_TABLET | Freq: Every day | ORAL | Status: DC
  Administered 2019-09-18 – 2019-09-20 (×3): 2.5 mg via ORAL
  Filled 2019-09-18 (×3): qty 1

## 2019-09-18 MED ORDER — POLYETHYLENE GLYCOL 3350 17 G PO PACK: 17.0000 g | PACK | Freq: Every day | ORAL | Status: DC | PRN

## 2019-09-18 MED ORDER — TRAZODONE HCL 50 MG PO TABS
150.0000 mg | ORAL_TABLET | Freq: Every day | ORAL | Status: DC
  Administered 2019-09-18 – 2019-09-19 (×2): 150 mg via ORAL
  Filled 2019-09-18 (×3): qty 3

## 2019-09-18 MED ORDER — AMLODIPINE BESYLATE 5 MG PO TABS: 2.5000 mg | ORAL_TABLET | Freq: Every day | ORAL | Status: DC

## 2019-09-18 MED ORDER — ALPRAZOLAM 1 MG PO TABS
1.0000 mg | ORAL_TABLET | Freq: Three times a day (TID) | ORAL | Status: DC
  Administered 2019-09-18 – 2019-09-20 (×6): 1 mg via ORAL
  Filled 2019-09-18 (×6): qty 1

## 2019-09-18 MED ORDER — ACETAMINOPHEN 325 MG PO TABS: 650.0000 mg | ORAL_TABLET | Freq: Four times a day (QID) | ORAL | Status: DC | PRN

## 2019-09-18 MED ORDER — OLANZAPINE 5 MG PO TABS
10.0000 mg | ORAL_TABLET | Freq: Every day | ORAL | Status: DC
  Administered 2019-09-18 – 2019-09-19 (×2): 10 mg via ORAL
  Filled 2019-09-18 (×3): qty 2

## 2019-09-18 MED ORDER — LABETALOL HCL 5 MG/ML IV SOLN
INTRAVENOUS | Status: AC
  Filled 2019-09-18: qty 4

## 2019-09-18 MED ORDER — ALPRAZOLAM 0.5 MG PO TABS
1.0000 mg | ORAL_TABLET | Freq: Three times a day (TID) | ORAL | Status: DC | PRN
  Administered 2019-09-18: 1 mg via ORAL
  Filled 2019-09-18: qty 2

## 2019-09-18 MED ORDER — ACETAMINOPHEN 650 MG RE SUPP: 650.0000 mg | Freq: Four times a day (QID) | RECTAL | Status: DC | PRN

## 2019-09-18 MED ORDER — ATORVASTATIN CALCIUM 10 MG PO TABS
10.0000 mg | ORAL_TABLET | Freq: Every day | ORAL | Status: DC
  Administered 2019-09-18 – 2019-09-19 (×2): 10 mg via ORAL
  Filled 2019-09-18 (×3): qty 1

## 2019-09-18 MED ORDER — QUETIAPINE FUMARATE 100 MG PO TABS
200.0000 mg | ORAL_TABLET | Freq: Every day | ORAL | Status: DC
  Administered 2019-09-18 – 2019-09-19 (×2): 200 mg via ORAL
  Filled 2019-09-18 (×3): qty 2

## 2019-09-18 MED ORDER — ENOXAPARIN SODIUM 30 MG/0.3ML ~~LOC~~ SOLN
30.0000 mg | SUBCUTANEOUS | Status: DC
  Administered 2019-09-19: 21:00:00 30 mg via SUBCUTANEOUS
  Filled 2019-09-18 (×2): qty 0.3

## 2019-09-18 MED ORDER — ONDANSETRON HCL 4 MG PO TABS: 4.0000 mg | ORAL_TABLET | Freq: Four times a day (QID) | ORAL | Status: DC | PRN

## 2019-09-18 MED ORDER — LABETALOL HCL 5 MG/ML IV SOLN
10.0000 mg | INTRAVENOUS | Status: DC | PRN
  Administered 2019-09-18: 18:00:00 10 mg via INTRAVENOUS

## 2019-09-18 MED ORDER — HYDROXYZINE HCL 25 MG PO TABS
25.0000 mg | ORAL_TABLET | Freq: Two times a day (BID) | ORAL | Status: DC
  Administered 2019-09-18 – 2019-09-20 (×5): 25 mg via ORAL
  Filled 2019-09-18 (×6): qty 1

## 2019-09-18 MED ORDER — DIVALPROEX SODIUM 250 MG PO DR TAB
500.0000 mg | DELAYED_RELEASE_TABLET | Freq: Every morning | ORAL | Status: DC
  Administered 2019-09-18 – 2019-09-19 (×2): 500 mg via ORAL
  Filled 2019-09-18 (×2): qty 2

## 2019-09-18 MED ORDER — SODIUM CHLORIDE 0.9 % IV BOLUS (SEPSIS)
1000.0000 mL | Freq: Once | INTRAVENOUS | Status: AC
  Administered 2019-09-18: 1000 mL via INTRAVENOUS

## 2019-09-18 MED ORDER — CARVEDILOL 3.125 MG PO TABS
6.2500 mg | ORAL_TABLET | Freq: Two times a day (BID) | ORAL | Status: DC
  Administered 2019-09-18 – 2019-09-20 (×4): 6.25 mg via ORAL
  Filled 2019-09-18 (×2): qty 2
  Filled 2019-09-18: qty 1
  Filled 2019-09-18: qty 2

## 2019-09-18 MED ORDER — SODIUM BICARBONATE 650 MG PO TABS
650.0000 mg | ORAL_TABLET | Freq: Two times a day (BID) | ORAL | Status: DC
  Administered 2019-09-18 – 2019-09-20 (×5): 650 mg via ORAL
  Filled 2019-09-18 (×7): qty 1

## 2019-09-18 MED ORDER — SODIUM CHLORIDE 0.9 % IV SOLN: INTRAVENOUS | Status: AC

## 2019-09-18 MED ORDER — ONDANSETRON 4 MG PO TBDP
4.0000 mg | ORAL_TABLET | Freq: Once | ORAL | Status: DC
  Filled 2019-09-18: qty 1

## 2019-09-18 MED ORDER — DIVALPROEX SODIUM 250 MG PO DR TAB
1000.0000 mg | DELAYED_RELEASE_TABLET | Freq: Every day | ORAL | Status: DC
  Administered 2019-09-19: 1000 mg via ORAL
  Filled 2019-09-18 (×3): qty 4

## 2019-09-18 MED ORDER — ONDANSETRON HCL 4 MG/2ML IJ SOLN
4.0000 mg | Freq: Once | INTRAMUSCULAR | Status: AC
  Administered 2019-09-18: 4 mg via INTRAVENOUS
  Filled 2019-09-18: qty 2
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
## 2019-09-18 NOTE — Progress Notes (Signed)
Patient unable to answer admission questions. Patient is oriented to self only. Contacted [employer], sister, and [facility]. Unable reach either one at this time.

## 2019-09-18 NOTE — ED Notes (Signed)
Pt vomited large amount. Dr Dina Rich notified. New orders given. Will continue to monitor.

## 2019-09-18 NOTE — ED Notes (Signed)
Pt given a shower and blue scrubs. Linens changed as well. Pt states he feels much better. Zofran not given at this time as a result.

## 2019-09-18 NOTE — ED Provider Notes (Signed)
While patient was in the emergency department waiting for placement to another group home he started to vomit.  Labs showed patient is having worsening renal failure.  Nephrology was consulted and they recommended IV fluids with admission to the hospitalist and nephrology will see the patient tomorrow   Milton Ferguson, MD 09/18/19 1659
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
## 2019-09-18 NOTE — ED Notes (Signed)
PT got nauseated and vomited x1. New order for blood work and nausea medication obtained from EDP at this time and entered.

## 2019-09-18 NOTE — ED Notes (Signed)
Nursing requested to EDP to order pt home medications.

## 2019-09-19 ENCOUNTER — Observation Stay (HOSPITAL_COMMUNITY): Payer: Medicare Other

## 2019-09-19 ENCOUNTER — Encounter (HOSPITAL_COMMUNITY): Payer: Self-pay | Admitting: Primary Care

## 2019-09-19 DIAGNOSIS — Z66 Do not resuscitate: Secondary | ICD-10-CM | POA: Diagnosis present

## 2019-09-19 DIAGNOSIS — F17210 Nicotine dependence, cigarettes, uncomplicated: Secondary | ICD-10-CM | POA: Diagnosis present

## 2019-09-19 DIAGNOSIS — Z7982 Long term (current) use of aspirin: Secondary | ICD-10-CM | POA: Diagnosis not present

## 2019-09-19 DIAGNOSIS — Z515 Encounter for palliative care: Secondary | ICD-10-CM | POA: Diagnosis not present

## 2019-09-19 DIAGNOSIS — J449 Chronic obstructive pulmonary disease, unspecified: Secondary | ICD-10-CM | POA: Diagnosis present

## 2019-09-19 DIAGNOSIS — Z7189 Other specified counseling: Secondary | ICD-10-CM | POA: Diagnosis not present

## 2019-09-19 DIAGNOSIS — Z9089 Acquired absence of other organs: Secondary | ICD-10-CM | POA: Diagnosis not present

## 2019-09-19 DIAGNOSIS — R456 Violent behavior: Secondary | ICD-10-CM | POA: Diagnosis present

## 2019-09-19 DIAGNOSIS — Z20828 Contact with and (suspected) exposure to other viral communicable diseases: Secondary | ICD-10-CM | POA: Diagnosis present

## 2019-09-19 DIAGNOSIS — F79 Unspecified intellectual disabilities: Secondary | ICD-10-CM | POA: Diagnosis present

## 2019-09-19 DIAGNOSIS — Z8673 Personal history of transient ischemic attack (TIA), and cerebral infarction without residual deficits: Secondary | ICD-10-CM | POA: Diagnosis not present

## 2019-09-19 DIAGNOSIS — R631 Polydipsia: Secondary | ICD-10-CM | POA: Diagnosis present

## 2019-09-19 DIAGNOSIS — I120 Hypertensive chronic kidney disease with stage 5 chronic kidney disease or end stage renal disease: Secondary | ICD-10-CM | POA: Diagnosis present

## 2019-09-19 DIAGNOSIS — Z5329 Procedure and treatment not carried out because of patient's decision for other reasons: Secondary | ICD-10-CM | POA: Diagnosis present

## 2019-09-19 DIAGNOSIS — Z79899 Other long term (current) drug therapy: Secondary | ICD-10-CM | POA: Diagnosis not present

## 2019-09-19 DIAGNOSIS — N185 Chronic kidney disease, stage 5: Secondary | ICD-10-CM | POA: Diagnosis present

## 2019-09-19 DIAGNOSIS — N179 Acute kidney failure, unspecified: Secondary | ICD-10-CM | POA: Diagnosis present

## 2019-09-19 DIAGNOSIS — E1122 Type 2 diabetes mellitus with diabetic chronic kidney disease: Secondary | ICD-10-CM | POA: Diagnosis present

## 2019-09-19 DIAGNOSIS — E785 Hyperlipidemia, unspecified: Secondary | ICD-10-CM | POA: Diagnosis present

## 2019-09-19 DIAGNOSIS — F209 Schizophrenia, unspecified: Secondary | ICD-10-CM | POA: Diagnosis present

## 2019-09-19 LAB — BASIC METABOLIC PANEL
Anion gap: 7 (ref 5–15)
BUN: 67 mg/dL — ABNORMAL HIGH (ref 6–20)
CO2: 26 mmol/L (ref 22–32)
Calcium: 9.5 mg/dL (ref 8.9–10.3)
Chloride: 110 mmol/L (ref 98–111)
Creatinine, Ser: 7.57 mg/dL — ABNORMAL HIGH (ref 0.61–1.24)
GFR calc Af Amer: 8 mL/min — ABNORMAL LOW (ref >60)
GFR calc non Af Amer: 7 mL/min — ABNORMAL LOW (ref >60)
Glucose, Bld: 75 mg/dL (ref 70–99)
Potassium: 4.3 mmol/L (ref 3.5–5.1)
Sodium: 143 mmol/L (ref 135–145)

## 2019-09-19 LAB — VALPROIC ACID LEVEL: Valproic Acid Lvl: 33 ug/mL — ABNORMAL LOW (ref 50.0–100.0)

## 2019-09-19 NOTE — Consult Note (Addendum)
Consultation Note Date: 09/19/2019   Patient Name: Kyle Valenzuela  DOB: 01/25/1961  MRN: [id number]  Age / Sex: 58 y.o., male  PCP: Sinda Du, MD Referring Physician: Sinda Du, MD  Reason for Consultation: Establishing goals of care  HPI/Patient Profile: 58 y.o. male  with past medical history of schizophrenia, hypertension, diabetes, COPD, CKD, resident of a group home, cigarette smoker admitted on 09/16/2019 with acute on chronic kidney failure.   Clinical Assessment and Goals of Care: Kyle Valenzuela is sitting up in the bed in his room.  Labs at bedside, unable to obtain blood sample.  Kyle Valenzuela has gotten worked up, stating he does not want his blood taken.  Kyle Valenzuela frequently apologizes stating that he was not trying to be disrespectful about having his blood drawn.  He does tell me that he would not want dialysis, no surgery, no further lab draws.  I attempt to talk with him more about his choices, but is clear he is becoming overexcited.  Kyle Valenzuela agrees for me to call his sister, Kyle Valenzuela.  He tells me that she is his legal guardian, along with his mother Kyle Valenzuela, age 8.  Call to Kyle Valenzuela at [phone], number not in service.  Call to Kyle Valenzuela at [phone].  Left voicemail message, requested return call.  Call to mother, Kyle Valenzuela.  Kyle Valenzuela tells me that Kyle Valenzuela is HCPOA.  We talk about NO HD, CODE STATUS.  Kyle Valenzuela tells me that Kyle Valenzuela has "a living will" and that he should be DNR status, no life support.  We briefly talked about [name] declining further lab draws.  Kyle Valenzuela shares that he allowed lab draws previously, and that she is not sure, but he may mean just for today.  I share that [name] would likely qualify for in facility hospice services.  Kyle Valenzuela states that she could accept hospice type care for [name].  Conference with nephrology and nursing staff related to patient  condition, needs.  PMT to continue to follow, reach out to family tomorrow.  Text page from nursing staff stating that sister, Kyle Valenzuela is at bedside requesting conference.  Kyle Valenzuela and I talked about Zapien behaviors.  He would clearly benefit from increase in/addition of mood stabilizing medications.  Kyle Valenzuela shares that she has contacted the group home, which will be having a change in ownership October 30.  There will be all new staff.  Kyle Valenzuela shares that her preference is for [name] to have a whole new ALF.  Kyle Valenzuela agrees DNR status, no surgery, no HD, requesting to be followed by hospice in whichever facility to which he is discharged.  She states he had been active with hospice until February of this year.  Preference is hospice of [facility].   HCPOA     LEGAL GUARDIAN -Kyle Valenzuela tells me that his mother and sister are his legal guardians.   SUMMARY OF RECOMMENDATIONS   At this point [name] tells me that he would not take dialysis, would not except surgery. He  also declines to have any further lab draws, but I am unsure if that is just today.  When I tried to further the conversation, it is clear that [name] is becoming more upset. We need a conference with family for direction of goals of care.  Code Status/Advance Care Planning:  DNR -CODE STATUS not discussed with patient today.  Mother states he has a living will, DNR status.   Symptom Management:   Per attending, no additional needs at this time.  Palliative Prophylaxis:   Frequent Pain Assessment and Oral Care  Additional Recommendations (Limitations, Scope, Preferences):  Treat the treatable but no CPR, no intubation, no hemodialysis, no surgery.  Psycho-social/Spiritual:   Desire for further Chaplaincy support:no  Additional Recommendations: Caregiving  Support/Resources and Education on Hospice  Prognosis:   < 6 months, or less would not be surprising based on worsening kidney function, patient and  family's desire for no hemodialysis.  Discharge Planning: To be determined, hopefully with hospice services in place.      Primary Diagnoses: Present on Admission: . Acute on chronic renal failure ([place])   I have reviewed the medical record, interviewed the patient and family, and examined the patient. The following aspects are pertinent.  Past Medical History:  Diagnosis Date  . Chronic kidney disease   . COPD (chronic obstructive pulmonary disease) ([place])   . Hyperglycemia   . Hypertension   . Polydipsia   . Schizophrenia [facility])    Social History   Socioeconomic History  . Marital status: Single    Spouse name: Not on file  . Number of children: Not on file  . Years of education: Not on file  . Highest education level: Not on file  Occupational History  . Not on file  Social Needs  . Financial resource strain: Not on file  . Food insecurity    Worry: Not on file    Inability: Not on file  . Transportation needs    Medical: Not on file    Non-medical: Not on file  Tobacco Use  . Smoking status: Current Every Day Smoker    Packs/day: 0.50    Types: Cigarettes  . Smokeless tobacco: Never Used  . Tobacco comment: 7 cigs per day  Substance and Sexual Activity  . Alcohol use: No  . Drug use: No  . Sexual activity: Not on file  Lifestyle  . Physical activity    Days per week: Not on file    Minutes per session: Not on file  . Stress: Not on file  Relationships  . Social Herbalist on phone: Not on file    Gets together: Not on file    Attends religious service: Not on file    Active member of club or organization: Not on file    Attends meetings of clubs or organizations: Not on file    Relationship status: Not on file  Other Topics Concern  . Not on file  Social History Narrative  . Not on file   History reviewed. No pertinent family history. Scheduled Meds: . ALPRAZolam  1 mg Oral TID  . amLODipine  2.5 mg Oral Daily  . atorvastatin  10 mg  Oral QHS  . carvedilol  6.25 mg Oral BID WC  . divalproex  1,000 mg Oral QHS  . divalproex  500 mg Oral q morning - 10a  . enoxaparin (LOVENOX) injection  30 mg Subcutaneous Q24H  . hydrOXYzine  25 mg Oral BID  .  OLANZapine  10 mg Oral QHS  . OLANZapine  5 mg Oral Daily  . QUEtiapine  200 mg Oral QHS  . sodium bicarbonate  650 mg Oral BID  . traZODone  150 mg Oral QHS   Continuous Infusions: . sodium chloride     PRN Meds:.acetaminophen **OR** acetaminophen, labetalol, ondansetron **OR** ondansetron (ZOFRAN) IV, polyethylene glycol Medications Prior to Admission:  Prior to Admission medications   Medication Sig Start Date End Date Taking? Authorizing Provider  ALPRAZolam Duanne Moron) 1 MG tablet Take 1 mg by mouth 3 (three) times daily.    Yes [provider]  amLODipine (NORVASC) 2.5 MG tablet Take 1 tablet (2.5 mg total) by mouth daily. 09/21/15  Yes Tat, Shanon Brow, MD  atorvastatin (LIPITOR) 10 MG tablet Take 1 tablet (10 mg total) by mouth daily at 6 PM. 09/21/15  Yes Tat, Shanon Brow, MD  carvedilol (COREG) 6.25 MG tablet Take 6.25 mg by mouth 2 (two) times daily with a meal.   Yes [provider]  divalproex (DEPAKOTE) 500 MG DR tablet Take 500 mg by mouth daily. Take 500 mg every morning   Yes [provider]  divalproex (DEPAKOTE) 500 MG DR tablet Take 1,000 mg by mouth at bedtime.   Yes [provider]  ergocalciferol (VITAMIN D2) 1.25 MG (50000 UT) capsule Take 50,000 Units by mouth once a week.   Yes [provider]  furosemide (LASIX) 20 MG tablet Take 20 mg by mouth daily.   Yes [provider]  hydrOXYzine (ATARAX/VISTARIL) 25 MG tablet Take 25 mg by mouth 2 (two) times daily.   Yes [provider]  hydrOXYzine (VISTARIL) 50 MG capsule Take 50 mg by mouth 3 (three) times daily as needed for anxiety.   Yes [provider]  iron polysaccharides (NIFEREX) 150 MG capsule Take 150 mg by mouth daily.   Yes [provider]  LORazepam (ATIVAN) 0.5 MG tablet Take 0.5 mg by mouth every 6 (six) hours as needed for anxiety.    Yes [provider]  OLANZapine (ZYPREXA) 10 MG tablet Take 20 mg by mouth at bedtime.    Yes [provider]  OLANZapine (ZYPREXA) 5 MG tablet Take 2.5 mg by mouth every morning.    Yes [provider]  polyethylene glycol powder (GLYCOLAX/MIRALAX) powder Take 17 g by mouth daily.   Yes [provider]  promethazine (PHENERGAN) 25 MG tablet Take 25 mg by mouth every 4 (four) hours as needed for nausea or vomiting.   Yes [provider]  QUEtiapine (SEROQUEL) 100 MG tablet Take 200 mg by mouth at bedtime.   Yes [provider]  QUEtiapine (SEROQUEL) 25 MG tablet Take 12.5 mg by mouth 2 (two) times daily.   Yes [provider]  sodium bicarbonate 650 MG tablet Take 650 mg by mouth 2 (two) times daily.   Yes [provider]  traZODone (DESYREL) 150 MG tablet Take 150 mg by mouth at bedtime.   Yes [provider]  albuterol (PROVENTIL HFA;VENTOLIN HFA) 108 (90 BASE) MCG/ACT inhaler Inhale 2 puffs into the lungs every 4 (four) hours as needed. For shortness of breath    [provider]  aspirin 325 MG tablet Take 1 tablet (325 mg total) by mouth daily. Patient not taking: Reported on 09/17/2019 09/21/15   TatShanon Brow, MD  sodium chloride (OCEAN) 0.65 % SOLN nasal spray Place 1 spray into both nostrils as needed for congestion.    [provider]  zolpidem (  AMBIEN) 10 MG tablet Take 10 mg by mouth at bedtime as needed for sleep.    [provider]   No Known Allergies Review of Systems  Unable to perform ROS: Psychiatric disorder    Physical Exam Vitals signs and nursing note reviewed.  Constitutional:      General: He is in acute distress.     Appearance: He is ill-appearing.     Comments: Makes and mostly keeps eye contact.  Pressured speech  HENT:     Head: Normocephalic  and atraumatic.  Cardiovascular:     Rate and Rhythm: Normal rate.  Pulmonary:     Effort: Pulmonary effort is normal. No respiratory distress.  Abdominal:     General: Abdomen is flat. There is no distension.  Musculoskeletal:        General: No swelling.  Skin:    General: Skin is warm and dry.  Neurological:     Mental Status: He is alert and oriented to person, place, and time.     Comments: Known schizophrenia, pressured speech at this time     Vital Signs: BP (!) 154/90 (BP Location: Left Arm)   Pulse 81   Temp 98.6 F (37 C) (Oral)   Resp 16   Ht 5\' 8"  (1.727 m)   Wt 70.2 kg   SpO2 100%   BMI 23.53 kg/m  Pain Scale: 0-10   Pain Score: 0-No pain   SpO2: SpO2: 100 % O2 Device:SpO2: 100 % O2 Flow Rate: .   IO: Intake/output summary:   Intake/Output Summary (Last 24 hours) at 09/19/2019 1328 Last data filed at 09/19/2019 1220 Gross per 24 hour  Intake 1480 ml  Output 3000 ml  Net -1520 ml    LBM: Last BM Date: 09/18/19 Baseline Weight: Weight: 77.1 kg Most recent weight: Weight: 70.2 kg     Palliative Assessment/Data:   Flowsheet Rows     Most Recent Value  Intake Tab  Referral Department  Hospitalist  Unit at Time of Referral  Med/Surg Unit  Palliative Care Primary Diagnosis  Nephrology  Date Notified  09/19/19  Palliative Care Type  New Palliative care  Reason for referral  Clarify Goals of Care  Date of Admission  09/16/19  Date first seen by Palliative Care  09/19/19  # of days Palliative referral response time  0 Day(s)  # of days IP prior to Palliative referral  3  Clinical Assessment  Palliative Performance Scale Score  60%  Pain Max last 24 hours  Not able to report  Pain Min Last 24 hours  Not able to report  Dyspnea Max Last 24 Hours  Not able to report  Dyspnea Min Last 24 hours  Not able to report  Psychosocial & Spiritual Assessment  Palliative Care Outcomes      Time In: 1040 Time Out: 1150 Time Total: 70 minutes  Greater  than 50%  of this time was spent counseling and coordinating care related to the above assessment and plan.  Signed by: Drue Novel, NP   Please contact Palliative Medicine Team phone at [phone] for questions and concerns.  For individual provider: See Shea Evans
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
## 2019-09-19 NOTE — NC FL2 (Signed)
[place] MEDICAID FL2 LEVEL OF CARE SCREENING TOOL     IDENTIFICATION  Patient Name: Kyle Valenzuela Birthdate: 11-Jul-1961 Sex: male Admission Date (Current Location): 09/16/2019  [place] and [place] Number:  [place] [id number] [place] and Address:  [facility] [address], [place]      Provider Number: [id number]  Attending Physician Name and Address:  Sinda Du, MD  Relative Name and Phone Number:       Current Level of Care: Hospital Recommended Level of Care: [place] Prior Approval Number:    Date Approved/Denied:   PASRR Number:    Discharge Plan: Domiciliary (Rest home)    Current Diagnoses: Patient Active Problem List   Diagnosis Date Noted  . Goals of care, counseling/discussion   . DNR (do not resuscitate) discussion   . Palliative care by specialist   . Acute on chronic renal failure ([place]) 09/18/2019  . 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 Chronic kidney disease (CKD), stage IV (severe) ([place]) 09/19/2015  . Essential hypertension 09/19/2015  . Gait instability 09/19/2015  . Cerebellar stroke, acute ([place])   . HLD (hyperlipidemia)   . Mental retardation   . Acute CVA (cerebrovascular accident) ([place]) 09/18/2015  . Fall 09/18/2015  . HTN (hypertension) 09/18/2015  . Stroke ([place]) 09/18/2015  . 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 Psychogenic polydipsia   . Schizophrenia, unspecified type ([place])   . End stage renal disease ([place]) 03/01/2012    Orientation RESPIRATION BLADDER Height & Weight     Self, Place  Normal Continent Weight: 70.2 kg Height:  5\' 8"  (172.7 cm)  BEHAVIORAL SYMPTOMS/MOOD NEUROLOGICAL BOWEL NUTRITION STATUS  Verbally abusive, Physically abusive (none) Continent Diet  AMBULATORY STATUS COMMUNICATION OF NEEDS Skin   Independent Verbally Normal                       Personal Care Assistance Level of Assistance  Bathing, Feeding,  Dressing Bathing Assistance: Independent Feeding assistance: Independent Dressing Assistance: Independent     Functional Limitations Info  Sight, Hearing, Speech Sight Info: Adequate Hearing Info: Adequate Speech Info: Adequate    SPECIAL CARE FACTORS FREQUENCY                       Contractures Contractures Info: Not present    Additional Factors Info  Code Status, Allergies, Psychotropic Code Status Info: DNR Allergies Info: NKA Psychotropic Info: Prescribed medications         Current Medications (09/19/2019):  This is the current hospital active medication list Current Facility-Administered Medications  Medication Dose Route Frequency Provider Last Rate Last Dose  . 0.9 %  sodium chloride infusion   Intravenous Continuous Emokpae, Ejiroghene E, MD      . acetaminophen (TYLENOL) tablet 650 mg  650 mg Oral Q6H PRN Emokpae, Ejiroghene E, MD       Or  . acetaminophen (TYLENOL) suppository 650 mg  650 mg Rectal Q6H PRN Emokpae, Ejiroghene E, MD      . ALPRAZolam Duanne Moron) tablet 1 mg  1 mg Oral TID Emokpae, Ejiroghene E, MD   1 mg at 09/19/19 0835  . amLODipine (NORVASC) tablet 2.5 mg  2.5 mg Oral Daily Fredia Sorrow, MD   2.5 mg at 09/19/19 0835  . atorvastatin (LIPITOR) tablet 10 mg  10 mg Oral QHS Emokpae, Ejiroghene E, MD   10 mg at 09/18/19 2058  . carvedilol (COREG) tablet 6.25 mg  6.25 mg Oral BID WC Emokpae, Ejiroghene E, MD   6.25 mg at 09/19/19 0835  . divalproex (DEPAKOTE) DR tablet 1,000 mg  1,000 mg Oral QHS Emokpae, Ejiroghene E, MD      . divalproex (DEPAKOTE) DR tablet 500 mg  500 mg Oral q morning - 10a Emokpae, Ejiroghene E, MD   500 mg at 09/19/19 0834  . enoxaparin (LOVENOX) injection 30 mg  30 mg Subcutaneous Q24H Emokpae, Ejiroghene E, MD      . hydrOXYzine (ATARAX/VISTARIL) tablet 25 mg  25 mg Oral BID Emokpae, Ejiroghene E, MD   25 mg at 09/19/19 0835  . labetalol (NORMODYNE) injection 10 mg  10 mg Intravenous Q2H PRN Emokpae, Ejiroghene E, MD    10 mg at 09/18/19 1826  . OLANZapine (ZYPREXA) tablet 10 mg  10 mg Oral QHS Emokpae, Ejiroghene E, MD   10 mg at 09/18/19 2058  . OLANZapine (ZYPREXA) tablet 5 mg  5 mg Oral Daily Fredia Sorrow, MD   5 mg at 09/19/19 0835  . ondansetron (ZOFRAN) tablet 4 mg  4 mg Oral Q6H PRN Emokpae, Ejiroghene E, MD       Or  . ondansetron (ZOFRAN) injection 4 mg  4 mg Intravenous Q6H PRN Emokpae, Ejiroghene E, MD      . polyethylene glycol (MIRALAX / GLYCOLAX) packet 17 g  17 g Oral Daily PRN Emokpae, Ejiroghene E, MD      . QUEtiapine (SEROQUEL) tablet 200 mg  200 mg Oral QHS Emokpae, Ejiroghene E, MD   200 mg at 09/18/19 2057  . sodium bicarbonate tablet 650 mg  650 mg Oral BID Emokpae, Ejiroghene E, MD   650 mg at 09/19/19 0834  . traZODone (DESYREL) tablet 150 mg  150 mg Oral QHS Emokpae, Ejiroghene E, MD   150 mg at 09/18/19 2056     Discharge Medications: Please see discharge summary for a list of discharge medications.  Relevant Imaging Results:  Relevant Lab Results:   Additional Information [place], [place]

## 2019-09-19 NOTE — Consult Note (Signed)
Reason for Consult: AKI/CKD vs progressive CKD Referring Physician:  Luan Pulling, MD  Kyle Valenzuela is an 58 y.o. male.  HPI: Kyle Valenzuela has a PMH significant for HTN, schizophrenia, COPD, h/o CVA, psychogenic polydipsia, and CKD stage 4-5 who was brought to [facility] ED on 09/16/19 after being arrested for an outstanding warrant and noted to be disoriented.  Apparently he had been aggressive with a staff member.  He was refused return to his current group home due to his behavior and was admitted for placement.  He did develop some N/V in the ED and started on IVF's.  We were consulted due to worsening of his renal function (at least since it was last checked in 2017).  Of note, he had been followed by Dr. Lowanda Foster for his progressive CKD and underwent AVF creation in 2013, however it clotted in December 2013 and he clearly stated that he would not have another fistula placed because he would rather die than do dialysis.  I discussed his progressive CKD with him today and he again clearly stated that he would not proceed with access placement or dialysis.  "I will die before that".  He is currently without any N/V/dysguesia, or signs/symptoms of uremia.  The trend in Scr is seen below.    Trend in Creatinine: Creatinine, Ser  Date/Time Value Ref Range Status  09/19/2019 04:55 AM 7.57 (H) 0.61 - 1.24 mg/dL Final  09/18/2019 03:32 PM 7.73 (H) 0.61 - 1.24 mg/dL Final  08/04/2016 02:48 PM 4.77 (H) 0.61 - 1.24 mg/dL Final  10/05/2015 08:20 AM 4.49 (H) 0.61 - 1.24 mg/dL Final  09/27/2015 07:20 AM 4.49 (H) 0.61 - 1.24 mg/dL Final  09/20/2015 02:44 AM 4.10 (H) 0.61 - 1.24 mg/dL Final  09/18/2015 05:20 PM 4.08 (H) 0.61 - 1.24 mg/dL Final  09/18/2015 11:17 AM 4.18 (H) 0.61 - 1.24 mg/dL Final  05/11/2015 03:23 PM 4.19 (H) 0.61 - 1.24 mg/dL Final  05/03/2015 04:06 PM 4.40 (H) 0.61 - 1.24 mg/dL Final  11/23/2014 03:46 PM 4.13 (H) 0.50 - 1.35 mg/dL Final  07/31/2009 06:35 AM 2.78 (H) 0.4 - 1.5 mg/dL Final   07/30/2009 04:10 PM 2.55 (H) 0.4 - 1.5 mg/dL Final    PMH:   Past Medical History:  Diagnosis Date  . Chronic kidney disease   . COPD (chronic obstructive pulmonary disease) ([place])   . Hyperglycemia   . Hypertension   . Polydipsia   . Schizophrenia ([place])     PSH:   Past Surgical History:  Procedure Laterality Date  . AV FISTULA PLACEMENT  04/22/2012   Procedure: ARTERIOVENOUS (AV) FISTULA CREATION;  Surgeon: Serafina Mitchell, MD;  Location: [place];  Service: Vascular;  Laterality: Right;  . AV FISTULA PLACEMENT  07/30/12   Right Ligation of competing branches  AVF  . MULTIPLE TOOTH EXTRACTIONS    . TONSILLECTOMY     as a child    Allergies: No Known Allergies  Medications:   Prior to Admission medications   Medication Sig Start Date End Date Taking? Authorizing Provider  ALPRAZolam Duanne Moron) 1 MG tablet Take 1 mg by mouth 3 (three) times daily.    Yes [provider]  amLODipine (NORVASC) 2.5 MG tablet Take 1 tablet (2.5 mg total) by mouth daily. 09/21/15  Yes Tat, Shanon Brow, MD  atorvastatin (LIPITOR) 10 MG tablet Take 1 tablet (10 mg total) by mouth daily at 6 PM. 09/21/15  Yes Tat, Shanon Brow, MD  carvedilol (COREG) 6.25 MG tablet Take 6.25 mg by mouth  2 (two) times daily with a meal.   Yes [provider]  divalproex (DEPAKOTE) 500 MG DR tablet Take 500 mg by mouth daily. Take 500 mg every morning   Yes [provider]  divalproex (DEPAKOTE) 500 MG DR tablet Take 1,000 mg by mouth at bedtime.   Yes [provider]  ergocalciferol (VITAMIN D2) 1.25 MG (50000 UT) capsule Take 50,000 Units by mouth once a week.   Yes [provider]  furosemide (LASIX) 20 MG tablet Take 20 mg by mouth daily.   Yes [provider]  hydrOXYzine (ATARAX/VISTARIL) 25 MG tablet Take 25 mg by mouth 2 (two) times daily.   Yes [provider]  hydrOXYzine (VISTARIL) 50 MG capsule Take 50 mg by mouth 3 (three) times daily as needed for anxiety.   Yes  [provider]  iron polysaccharides (NIFEREX) 150 MG capsule Take 150 mg by mouth daily.   Yes [provider]  LORazepam (ATIVAN) 0.5 MG tablet Take 0.5 mg by mouth every 6 (six) hours as needed for anxiety.    Yes [provider]  OLANZapine (ZYPREXA) 10 MG tablet Take 20 mg by mouth at bedtime.    Yes [provider]  OLANZapine (ZYPREXA) 5 MG tablet Take 2.5 mg by mouth every morning.    Yes [provider]  polyethylene glycol powder (GLYCOLAX/MIRALAX) powder Take 17 g by mouth daily.   Yes [provider]  promethazine (PHENERGAN) 25 MG tablet Take 25 mg by mouth every 4 (four) hours as needed for nausea or vomiting.   Yes [provider]  QUEtiapine (SEROQUEL) 100 MG tablet Take 200 mg by mouth at bedtime.   Yes [provider]  QUEtiapine (SEROQUEL) 25 MG tablet Take 12.5 mg by mouth 2 (two) times daily.   Yes [provider]  sodium bicarbonate 650 MG tablet Take 650 mg by mouth 2 (two) times daily.   Yes [provider]  traZODone (DESYREL) 150 MG tablet Take 150 mg by mouth at bedtime.   Yes [provider]  albuterol (PROVENTIL HFA;VENTOLIN HFA) 108 (90 BASE) MCG/ACT inhaler Inhale 2 puffs into the lungs every 4 (four) hours as needed. For shortness of breath    [provider]  aspirin 325 MG tablet Take 1 tablet (325 mg total) by mouth daily. Patient not taking: Reported on 09/17/2019 09/21/15   TatShanon Brow, MD  sodium chloride (OCEAN) 0.65 % SOLN nasal spray Place 1 spray into both nostrils as needed for congestion.    [provider]  zolpidem (AMBIEN) 10 MG tablet Take 10 mg by mouth at bedtime as needed for sleep.    [provider]    Inpatient medications: . ALPRAZolam  1 mg Oral TID  . amLODipine  2.5 mg Oral Daily  . atorvastatin  10 mg Oral QHS  . carvedilol  6.25 mg Oral BID WC  . divalproex  1,000 mg Oral QHS  . divalproex  500 mg Oral q morning  - 10a  . enoxaparin (LOVENOX) injection  30 mg Subcutaneous Q24H  . hydrOXYzine  25 mg Oral BID  . OLANZapine  10 mg Oral QHS  . OLANZapine  5 mg Oral Daily  . QUEtiapine  200 mg Oral QHS  . sodium bicarbonate  650 mg Oral BID  . traZODone  150 mg Oral QHS    Discontinued Meds:   Medications Discontinued During This Encounter  Medication Reason  . sodium chloride 0.9 % bolus 1,000  mL   . ALPRAZolam (XANAX) tablet 1 mg   . ondansetron (ZOFRAN-ODT) disintegrating tablet 4 mg   . iron polysaccharides (NIFEREX) capsule 150 mg   . amLODipine (NORVASC) tablet 2.5 mg Duplicate    Social History:  reports that he has been smoking cigarettes. He has been smoking about 0.50 packs per day. He has never used smokeless tobacco. He reports that he does not drink alcohol or use drugs.  Family History:  No family history on file.  Pertinent items are noted in HPI. Weight change:   Intake/Output Summary (Last 24 hours) at 09/19/2019 0935 Last data filed at 09/19/2019 0900 Gross per 24 hour  Intake 1480 ml  Output 2200 ml  Net -720 ml   BP (!) 154/90 (BP Location: Left Arm)   Pulse 81   Temp 98.6 F (37 C) (Oral)   Resp 16   Ht 5\' 8"  (1.727 m)   Wt 70.2 kg   SpO2 100%   BMI 23.53 kg/m  Vitals:   09/18/19 2028 09/18/19 2031 09/18/19 2036 09/19/19 0633  BP:  (!) 175/99  (!) 154/90  Pulse:  75  81  Resp:  (!) 22  16  Temp:   (!) 97.3 F (36.3 C) 98.6 F (37 C)  TempSrc:   Oral Oral  SpO2:  98%  100%  Weight: 70.2 kg     Height:         General appearance: alert, cooperative, no distress and pale Head: Normocephalic, without obvious abnormality, atraumatic Resp: clear to auscultation bilaterally Cardio: no rub GI: soft, non-tender; bowel sounds normal; no masses,  no organomegaly Extremities: extremities normal, atraumatic, no cyanosis or edema  Labs: Basic Metabolic Panel: Recent Labs  Lab 09/18/19 1532 09/19/19 0455  NA 142 143  K 4.7 4.3  CL 107 110  CO2 22 26   GLUCOSE 135* 75  BUN 73* 67*  CREATININE 7.73* 7.57*  ALBUMIN 4.1  --   CALCIUM 9.8 9.5   Liver Function Tests: Recent Labs  Lab 09/18/19 1532  AST 23  ALT 20  ALKPHOS 76  BILITOT 0.6  PROT 7.4  ALBUMIN 4.1   Recent Labs  Lab 09/18/19 1532  LIPASE 31   No results for input(s): AMMONIA in the last 168 hours. CBC: Recent Labs  Lab 09/18/19 1532  WBC 5.8  NEUTROABS 4.4  HGB 12.2*  HCT 38.7*  MCV 99.0  PLT 157   PT/INR: @LABRCNTIP (inr:5) Cardiac Enzymes: ) Recent Labs  Lab 09/18/19 1532  CKTOTAL 315   CBG: Recent Labs  Lab 09/18/19 1900 09/18/19 2035  GLUCAP 119* 135*    Iron Studies: No results for input(s): IRON, TIBC, TRANSFERRIN, FERRITIN in the last 168 hours.  Xrays/Other Studies: No results found.   Assessment/Plan: 1.   AKI/CKD vs progressive CKD stage 5- this is likely progressive disease as he is relatively asymptomatic.  I discussed with him the severity and chronicity of his CKD.  He was quite clear that he would not have another access surgery and would rather die than undergo HD.  He is a poor candidate given his behavioral issues as well.  Will consult palliative care to help set goals/limits of care.  2. 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  On multiple medications.  Will need new placement and possible psych evaluation 3. COPD- stable 4. HTN- resume outpatient meds and follow.  5. Disposition- palliative care consult and placement pending.    Kyle Valenzuela 09/19/2019, 9:35 AM

## 2019-09-20 DIAGNOSIS — Z7189 Other specified counseling: Secondary | ICD-10-CM

## 2019-09-20 LAB — RENAL FUNCTION PANEL
Albumin: 3.3 g/dL — ABNORMAL LOW (ref 3.5–5.0)
Anion gap: 13 (ref 5–15)
BUN: 69 mg/dL — ABNORMAL HIGH (ref 6–20)
CO2: 19 mmol/L — ABNORMAL LOW (ref 22–32)
Calcium: 9 mg/dL (ref 8.9–10.3)
Chloride: 104 mmol/L (ref 98–111)
Creatinine, Ser: 7.2 mg/dL — ABNORMAL HIGH (ref 0.61–1.24)
GFR calc Af Amer: 9 mL/min — ABNORMAL LOW (ref >60)
GFR calc non Af Amer: 7 mL/min — ABNORMAL LOW (ref >60)
Glucose, Bld: 78 mg/dL (ref 70–99)
Phosphorus: 6 mg/dL — ABNORMAL HIGH (ref 2.5–4.6)
Potassium: 4.1 mmol/L (ref 3.5–5.1)
Sodium: 136 mmol/L (ref 135–145)

## 2019-09-20 LAB — CBC
HCT: 32.5 % — ABNORMAL LOW (ref 39.0–52.0)
Hemoglobin: 10.6 g/dL — ABNORMAL LOW (ref 13.0–17.0)
MCH: 32.1 pg (ref 26.0–34.0)
MCHC: 32.6 g/dL (ref 30.0–36.0)
MCV: 98.5 fL (ref 80.0–100.0)
Platelets: 124 10*3/uL — ABNORMAL LOW (ref 150–400)
RBC: 3.3 MIL/uL — ABNORMAL LOW (ref 4.22–5.81)
RDW: 13.5 % (ref 11.5–15.5)
WBC: 6.6 10*3/uL (ref 4.0–10.5)
nRBC: 0 % (ref 0.0–0.2)

## 2019-09-20 LAB — HIV ANTIBODY (ROUTINE TESTING W REFLEX): HIV Screen 4th Generation wRfx: NONREACTIVE

## 2019-09-20 MED ORDER — DIVALPROEX SODIUM 250 MG PO DR TAB: 1500.0000 mg | DELAYED_RELEASE_TABLET | Freq: Every day | ORAL | Status: DC

## 2019-09-20 MED ORDER — DIVALPROEX SODIUM 500 MG PO DR TAB: DELAYED_RELEASE_TABLET | ORAL | 0 refills | Status: AC

## 2019-09-20 MED ORDER — DIVALPROEX SODIUM 250 MG PO DR TAB
1000.0000 mg | DELAYED_RELEASE_TABLET | Freq: Every morning | ORAL | Status: DC
  Administered 2019-09-20: 09:00:00 1000 mg via ORAL
  Filled 2019-09-20: qty 4

## 2019-09-20 NOTE — Discharge Summary (Signed)
Physician Discharge Summary  Patient ID: Kyle Valenzuela MRN: [id number] DOB/AGE: August 27, 1961 58 y.o. Primary Care Physician:Charlie Char, Percell Miller, MD Admit date: 09/16/2019 Discharge date: 09/20/2019    Discharge Diagnoses:   Active Problems:   Acute on chronic renal failure (HCC)   Goals of care, counseling/discussion   DNR (do not resuscitate) discussion   Palliative care by specialist schizophrenia Nicotine abuse Copd Hypertension hyperlipidemia  Allergies as of 09/20/2019   No Known Allergies     Medication List    TAKE these medications   albuterol 108 (90 Base) MCG/ACT inhaler Commonly known as: VENTOLIN HFA Inhale 2 puffs into the lungs every 4 (four) hours as needed. For shortness of breath   ALPRAZolam 1 MG tablet Commonly known as: XANAX Take 1 mg by mouth 3 (three) times daily.   amLODipine 2.5 MG tablet Commonly known as: NORVASC Take 1 tablet (2.5 mg total) by mouth daily.   aspirin 325 MG tablet Take 1 tablet (325 mg total) by mouth daily.   atorvastatin 10 MG tablet Commonly known as: LIPITOR Take 1 tablet (10 mg total) by mouth daily at 6 PM.   carvedilol 6.25 MG tablet Commonly known as: COREG Take 6.25 mg by mouth 2 (two) times daily with a meal.   divalproex 500 MG DR tablet Commonly known as: DEPAKOTE Take 2 in the AM and 3 at night What changed:   how much to take  how to take this  when to take this  additional instructions  Another medication with the same name was removed. Continue taking this medication, and follow the directions you see here.   ergocalciferol 1.25 MG (50000 UT) capsule Commonly known as: VITAMIN D2 Take 50,000 Units by mouth once a week.   furosemide 20 MG tablet Commonly known as: LASIX Take 20 mg by mouth daily.   hydrOXYzine 25 MG tablet Commonly known as: ATARAX/VISTARIL Take 25 mg by mouth 2 (two) times daily.   hydrOXYzine 50 MG capsule Commonly known as: VISTARIL Take 50 mg by mouth 3 (three)  times daily as needed for anxiety.   iron polysaccharides 150 MG capsule Commonly known as: NIFEREX Take 150 mg by mouth daily.   LORazepam 0.5 MG tablet Commonly known as: ATIVAN Take 0.5 mg by mouth every 6 (six) hours as needed for anxiety.   OLANZapine 5 MG tablet Commonly known as: ZYPREXA Take 2.5 mg by mouth every morning.   OLANZapine 10 MG tablet Commonly known as: ZYPREXA Take 20 mg by mouth at bedtime.   polyethylene glycol powder 17 GM/SCOOP powder Commonly known as: GLYCOLAX/MIRALAX Take 17 g by mouth daily.   promethazine 25 MG tablet Commonly known as: PHENERGAN Take 25 mg by mouth every 4 (four) hours as needed for nausea or vomiting.   QUEtiapine 25 MG tablet Commonly known as: SEROQUEL Take 12.5 mg by mouth 2 (two) times daily.   QUEtiapine 100 MG tablet Commonly known as: SEROQUEL Take 200 mg by mouth at bedtime.   sodium bicarbonate 650 MG tablet Take 650 mg by mouth 2 (two) times daily.   sodium chloride 0.65 % Soln nasal spray Commonly known as: OCEAN Place 1 spray into both nostrils as needed for congestion.   traZODone 150 MG tablet Commonly known as: DESYREL Take 150 mg by mouth at bedtime.   zolpidem 10 MG tablet Commonly known as: AMBIEN Take 10 mg by mouth at bedtime as needed for sleep.       Discharged Condition:unchanged    Consults:nephrology,telepsychiatry  Significant Diagnostic Studies: No results found.  Lab Results: Basic Metabolic Panel: Recent Labs    09/19/19 0455 09/20/19 0456  NA 143 136  K 4.3 4.1  CL 110 104  CO2 26 19*  GLUCOSE 75 78  BUN 67* 69*  CREATININE 7.57* 7.20*  CALCIUM 9.5 9.0  PHOS  --  6.0*   Liver Function Tests: Recent Labs    09/18/19 1532 09/20/19 0456  AST 23  --   ALT 20  --   ALKPHOS 76  --   BILITOT 0.6  --   PROT 7.4  --   ALBUMIN 4.1 3.3*     CBC: Recent Labs    09/18/19 1532 09/20/19 0456  WBC 5.8 6.6  NEUTROABS 4.4  --   HGB 12.2* 10.6*  HCT 38.7*  32.5*  MCV 99.0 98.5  PLT 157 124*    Recent Results (from the past 240 hour(s))  SARS Coronavirus 2 [facility] order, Performed in [facility] hospital lab) Nasopharyngeal Nasopharyngeal Swab     Status: None   Collection Time: 09/18/19  6:30 PM   Specimen: Nasopharyngeal Swab  Result Value Ref Range Status   SARS Coronavirus 2 NEGATIVE NEGATIVE Final    Comment: (NOTE) If result is NEGATIVE SARS-CoV-2 target nucleic acids are NOT DETECTED. The SARS-CoV-2 RNA is generally detectable in upper and lower  respiratory specimens during the acute phase of infection. The lowest  concentration of SARS-CoV-2 viral copies this assay can detect is 250  copies / mL. A negative result does not preclude SARS-CoV-2 infection  and should not be used as the sole basis for treatment or other  patient management decisions.  A negative result may occur with  improper specimen collection / handling, submission of specimen other  than nasopharyngeal swab, presence of viral mutation(s) within the  areas targeted by this assay, and inadequate number of viral copies  (<250 copies / mL). A negative result must be combined with clinical  observations, patient history, and epidemiological information. If result is POSITIVE SARS-CoV-2 target nucleic acids are DETECTED. The SARS-CoV-2 RNA is generally detectable in upper and lower  respiratory specimens dur ing the acute phase of infection.  Positive  results are indicative of active infection with SARS-CoV-2.  Clinical  correlation with patient history and other diagnostic information is  necessary to determine patient infection status.  Positive results do  not rule out bacterial infection or co-infection with other viruses. If result is PRESUMPTIVE POSTIVE SARS-CoV-2 nucleic acids MAY BE PRESENT.   A presumptive positive result was obtained on the submitted specimen  and confirmed on repeat testing.  While 2019 novel coronavirus  (SARS-CoV-2) nucleic  acids may be present in the submitted sample  additional confirmatory testing may be necessary for epidemiological  and / or clinical management purposes  to differentiate between  SARS-CoV-2 and other Sarbecovirus currently known to infect humans.  If clinically indicated additional testing with an alternate test  methodology [phone]) is advised. The SARS-CoV-2 RNA is generally  detectable in upper and lower respiratory sp ecimens during the acute  phase of infection. The expected result is Negative. Fact Sheet for Patients:  StrictlyIdeas.no Fact Sheet for Healthcare Providers: BankingDealers.co.za This test is not yet approved or cleared by the [place] FDA and has been authorized for detection and/or diagnosis of SARS-CoV-2 by FDA under an Emergency Use Authorization (EUA).  This EUA will remain in effect (meaning this test can be used) for the duration of the COVID-19 declaration under  Section 564(b)(1) of the Act, 21 U.S.C. section 360bbb-3(b)(1), unless the authorization is terminated or revoked sooner. Performed at [facility], [address], [place], [place] [address]      Hospital Course: he was brought to the ED because of confusion and behavioral issues. He was found to have markedly elevated creatinine. depakote level was low. He had previous known kidney disease and  Refused dialysis. He refuses again. He had palliative ,psychiatry and nephrology consults. depakote dose was increased. He said he would rather die than do dialysis. Hospice follow up and psych follow up arranged. He will return to ALF  Discharge Exam: Blood pressure (!) 154/86, pulse 73, temperature (!) 97.4 F (36.3 C), temperature source Oral, resp. rate 19, height 5\' 8"  (1.727 m), weight 70.2 kg, SpO2 100 %. He is awake and alert. Chest is clear. Heart regular.   Disposition: back to alf with hospice    Follow-up Information    Sinda Du, MD.    Specialty: Pulmonary Disease Contact information: [address] [place] [address] [phone]           Signed: Alonza Bogus   09/20/2019, 1:26 PM

## 2019-09-20 NOTE — Progress Notes (Signed)
Palliative: Mr. Celestino, [place], is resting quietly in bed.  He is calm and cooperative today.  He has had an increase in his Depakote, and seems more calm.  Contact with sister/HC POA, Robin, to discuss plan for discharge.  Although Mr. Ammie Ferrier is going to be discharged with hospice care, he would clearly benefit from consult with psychiatry to manage his behavior/moods.  Robin request [facility].    Plan:  Return to group home with [facility].  NO HD, no surgery. Would benefit from psychiatry outpatient for quality of life.  DNR status.   41 minutes Quinn Axe, NP Palliative Medicine Team Team Phone # [phone] Greater than 50% of this time was spent counseling and coordinating care related to the above assessment and plan.

## 2019-09-20 NOTE — Progress Notes (Signed)
Subjective: He does not want to interact with me today.  Objective: Vital signs in last 24 hours: Temp:  [97.4 F (36.3 C)-97.7 F (36.5 C)] 97.4 F (36.3 C) (09/29 0718) Pulse Rate:  [73-78] 73 (09/29 0718) Resp:  [16-20] 19 (09/29 0718) BP: (136-154)/(86-95) 154/86 (09/29 0718) SpO2:  [99 %-100 %] 100 % (09/29 0718) Weight change:  Last BM Date: 09/18/19  Intake/Output from previous day: 09/28 0701 - 09/29 0700 In: 1440 [P.O.:1440] Out: 3650 [Urine:3650]  PHYSICAL EXAM General appearance: alert, no distress and Aggressive Resp: Refused exam Cardio: Refused exam GI: Refused exam Extremities: Refused exam  Lab Results:  Results for orders placed or performed during the hospital encounter of 09/16/19 (from the past 48 hour(s))  CBC with Differential     Status: Abnormal   Collection Time: 09/18/19  3:32 PM  Result Value Ref Range   WBC 5.8 4.0 - 10.5 K/uL   RBC 3.91 (L) 4.22 - 5.81 MIL/uL   Hemoglobin 12.2 (L) 13.0 - 17.0 g/dL   HCT 38.7 (L) 39.0 - 52.0 %   MCV 99.0 80.0 - 100.0 fL   MCH 31.2 26.0 - 34.0 pg   MCHC 31.5 30.0 - 36.0 g/dL   RDW 14.1 11.5 - 15.5 %   Platelets 157 150 - 400 K/uL   nRBC 0.0 0.0 - 0.2 %   Neutrophils Relative % 75 %   Neutro Abs 4.4 1.7 - 7.7 K/uL   Lymphocytes Relative 14 %   Lymphs Abs 0.8 0.7 - 4.0 K/uL   Monocytes Relative 10 %   Monocytes Absolute 0.6 0.1 - 1.0 K/uL   Eosinophils Relative 0 %   Eosinophils Absolute 0.0 0.0 - 0.5 K/uL   Basophils Relative 0 %   Basophils Absolute 0.0 0.0 - 0.1 K/uL   Immature Granulocytes 1 %   Abs Immature Granulocytes 0.04 0.00 - 0.07 K/uL    Comment: Performed at [facility], [address], [place], [place] [address]  Comprehensive metabolic panel     Status: Abnormal   Collection Time: 09/18/19  3:32 PM  Result Value Ref Range   Sodium 142 135 - 145 mmol/L   Potassium 4.7 3.5 - 5.1 mmol/L   Chloride 107 98 - 111 mmol/L   CO2 22 22 - 32 mmol/L   Glucose, Bld 135 (H) 70 - 99 mg/dL   BUN  73 (H) 6 - 20 mg/dL   Creatinine, Ser 7.73 (H) 0.61 - 1.24 mg/dL   Calcium 9.8 8.9 - 10.3 mg/dL   Total Protein 7.4 6.5 - 8.1 g/dL   Albumin 4.1 3.5 - 5.0 g/dL   AST 23 15 - 41 U/L   ALT 20 0 - 44 U/L   Alkaline Phosphatase 76 38 - 126 U/L   Total Bilirubin 0.6 0.3 - 1.2 mg/dL   GFR calc non Af Amer 7 (L) >60 mL/min   GFR calc Af Amer 8 (L) >60 mL/min   Anion gap 13 5 - 15    Comment: Performed at [facility], [address] [place], [place] [address]  Lipase, blood     Status: None   Collection Time: 09/18/19  3:32 PM  Result Value Ref Range   Lipase 31 11 - 51 U/L    Comment: Performed at [facility], [address] [place], [place] [address]  CK     Status: None   Collection Time: 09/18/19  3:32 PM  Result Value Ref Range   Total CK 315 49 -  397 U/L    Comment: Performed at [facility], [address] [place], [place] [address]  HIV Antibody  (Routine Testing)     Status: None   Collection Time: 09/18/19  3:32 PM  Result Value Ref Range   HIV Screen 4th Generation wRfx Non Reactive Non Reactive    Comment: (NOTE) Performed At: [facility] [address] [place], [place] [id number] Rush Farmer MD [id number]   SARS Coronavirus 2 [facility] order, Performed in [facility] hospital lab) Nasopharyngeal Nasopharyngeal Swab     Status: None   Collection Time: 09/18/19  6:30 PM   Specimen: Nasopharyngeal Swab  Result Value Ref Range   SARS Coronavirus 2 NEGATIVE NEGATIVE    Comment: (NOTE) If result is NEGATIVE SARS-CoV-2 target nucleic acids are NOT DETECTED. The SARS-CoV-2 RNA is generally detectable in upper and lower  respiratory specimens during the acute phase of infection. The lowest  concentration of SARS-CoV-2 viral copies this assay can detect is 250  copies / mL. A negative result does not preclude SARS-CoV-2 infection  and should not be used as the sole basis for treatment or other  patient management decisions.  A negative result may occur with   improper specimen collection / handling, submission of specimen other  than nasopharyngeal swab, presence of viral mutation(s) within the  areas targeted by this assay, and inadequate number of viral copies  (<250 copies / mL). A negative result must be combined with clinical  observations, patient history, and epidemiological information. If result is POSITIVE SARS-CoV-2 target nucleic acids are DETECTED. The SARS-CoV-2 RNA is generally detectable in upper and lower  respiratory specimens dur ing the acute phase of infection.  Positive  results are indicative of active infection with SARS-CoV-2.  Clinical  correlation with patient history and other diagnostic information is  necessary to determine patient infection status.  Positive results do  not rule out bacterial infection or co-infection with other viruses. If result is PRESUMPTIVE POSTIVE SARS-CoV-2 nucleic acids MAY BE PRESENT.   A presumptive positive result was obtained on the submitted specimen  and confirmed on repeat testing.  While 2019 novel coronavirus  (SARS-CoV-2) nucleic acids may be present in the submitted sample  additional confirmatory testing may be necessary for epidemiological  and / or clinical management purposes  to differentiate between  SARS-CoV-2 and other Sarbecovirus currently known to infect humans.  If clinically indicated additional testing with an alternate test  methodology [phone]) is advised. The SARS-CoV-2 RNA is generally  detectable in upper and lower respiratory sp ecimens during the acute  phase of infection. The expected result is Negative. Fact Sheet for Patients:  StrictlyIdeas.no Fact Sheet for Healthcare Providers: BankingDealers.co.za This test is not yet approved or cleared by the [place] FDA and has been authorized for detection and/or diagnosis of SARS-CoV-2 by FDA under an Emergency Use Authorization (EUA).  This EUA will  remain in effect (meaning this test can be used) for the duration of the COVID-19 declaration under Section 564(b)(1) of the Act, 21 U.S.C. section 360bbb-3(b)(1), unless the authorization is terminated or revoked sooner. Performed at [facility], [address] [place], [place] [address]   CBG monitoring, ED     Status: Abnormal   Collection Time: 09/18/19  7:00 PM  Result Value Ref Range   Glucose-Capillary 119 (H) 70 - 99 mg/dL  Glucose, capillary     Status: Abnormal   Collection Time: 09/18/19  8:35 PM  Result Value Ref Range  Glucose-Capillary 135 (H) 70 - 99 mg/dL  Urinalysis, Routine w reflex microscopic     Status: Abnormal   Collection Time: 09/18/19  9:56 PM  Result Value Ref Range   Color, Urine STRAW (A) YELLOW   APPearance CLEAR CLEAR   Specific Gravity, Urine 1.008 1.005 - 1.030   pH 7.0 5.0 - 8.0   Glucose, UA 150 (A) NEGATIVE mg/dL   Hgb urine dipstick SMALL (A) NEGATIVE   Bilirubin Urine NEGATIVE NEGATIVE   Ketones, ur NEGATIVE NEGATIVE mg/dL   Protein, ur 100 (A) NEGATIVE mg/dL   Nitrite NEGATIVE NEGATIVE   Leukocytes,Ua NEGATIVE NEGATIVE   RBC / HPF 0-5 0 - 5 RBC/hpf   WBC, UA 0-5 0 - 5 WBC/hpf   Bacteria, UA NONE SEEN NONE SEEN    Comment: Performed at [facility], [address] [place], [place] [id number]  Basic metabolic panel     Status: Abnormal   Collection Time: 09/19/19  4:55 AM  Result Value Ref Range   Sodium 143 135 - 145 mmol/L   Potassium 4.3 3.5 - 5.1 mmol/L   Chloride 110 98 - 111 mmol/L   CO2 26 22 - 32 mmol/L   Glucose, Bld 75 70 - 99 mg/dL   BUN 67 (H) 6 - 20 mg/dL   Creatinine, Ser 7.57 (H) 0.61 - 1.24 mg/dL   Calcium 9.5 8.9 - 10.3 mg/dL   GFR calc non Af Amer 7 (L) >60 mL/min   GFR calc Af Amer 8 (L) >60 mL/min   Anion gap 7 5 - 15    Comment: Performed at [facility], [address] [place], [place] [address]  Valproic acid level     Status: Abnormal   Collection Time: 09/19/19  3:14 PM  Result Value Ref Range   Valproic  Acid Lvl 33 (L) 50.0 - 100.0 ug/mL    Comment: Performed at [facility], [address] [place], [place] [address]  Renal function panel     Status: Abnormal   Collection Time: 09/20/19  4:56 AM  Result Value Ref Range   Sodium 136 135 - 145 mmol/L    Comment: DELTA CHECK NOTED   Potassium 4.1 3.5 - 5.1 mmol/L   Chloride 104 98 - 111 mmol/L   CO2 19 (L) 22 - 32 mmol/L   Glucose, Bld 78 70 - 99 mg/dL   BUN 69 (H) 6 - 20 mg/dL   Creatinine, Ser 7.20 (H) 0.61 - 1.24 mg/dL   Calcium 9.0 8.9 - 10.3 mg/dL   Phosphorus 6.0 (H) 2.5 - 4.6 mg/dL   Albumin 3.3 (L) 3.5 - 5.0 g/dL   GFR calc non Af Amer 7 (L) >60 mL/min   GFR calc Af Amer 9 (L) >60 mL/min   Anion gap 13 5 - 15    Comment: Performed at [facility], [address] [place], [place] [address]  CBC     Status: Abnormal   Collection Time: 09/20/19  4:56 AM  Result Value Ref Range   WBC 6.6 4.0 - 10.5 K/uL   RBC 3.30 (L) 4.22 - 5.81 MIL/uL   Hemoglobin 10.6 (L) 13.0 - 17.0 g/dL   HCT 32.5 (L) 39.0 - 52.0 %   MCV 98.5 80.0 - 100.0 fL   MCH 32.1 26.0 - 34.0 pg   MCHC 32.6 30.0 - 36.0 g/dL   RDW 13.5 11.5 - 15.5 %   Platelets 124 (L) 150 - 400 K/uL   nRBC 0.0 0.0 - 0.2 %  Comment: Performed at [facility], [address] [place], [place] [address]    ABGS No results for input(s): PHART, PO2ART, TCO2, HCO3 in the last 72 hours.  Invalid input(s): PCO2 CULTURES Recent Results (from the past 240 hour(s))  SARS Coronavirus 2 [facility] order, Performed in [facility] hospital lab) Nasopharyngeal Nasopharyngeal Swab     Status: None   Collection Time: 09/18/19  6:30 PM   Specimen: Nasopharyngeal Swab  Result Value Ref Range Status   SARS Coronavirus 2 NEGATIVE NEGATIVE Final    Comment: (NOTE) If result is NEGATIVE SARS-CoV-2 target nucleic acids are NOT DETECTED. The SARS-CoV-2 RNA is generally detectable in upper and lower  respiratory specimens during the acute phase of infection. The lowest  concentration of  SARS-CoV-2 viral copies this assay can detect is 250  copies / mL. A negative result does not preclude SARS-CoV-2 infection  and should not be used as the sole basis for treatment or other  patient management decisions.  A negative result may occur with  improper specimen collection / handling, submission of specimen other  than nasopharyngeal swab, presence of viral mutation(s) within the  areas targeted by this assay, and inadequate number of viral copies  (<250 copies / mL). A negative result must be combined with clinical  observations, patient history, and epidemiological information. If result is POSITIVE SARS-CoV-2 target nucleic acids are DETECTED. The SARS-CoV-2 RNA is generally detectable in upper and lower  respiratory specimens dur ing the acute phase of infection.  Positive  results are indicative of active infection with SARS-CoV-2.  Clinical  correlation with patient history and other diagnostic information is  necessary to determine patient infection status.  Positive results do  not rule out bacterial infection or co-infection with other viruses. If result is PRESUMPTIVE POSTIVE SARS-CoV-2 nucleic acids MAY BE PRESENT.   A presumptive positive result was obtained on the submitted specimen  and confirmed on repeat testing.  While 2019 novel coronavirus  (SARS-CoV-2) nucleic acids may be present in the submitted sample  additional confirmatory testing may be necessary for epidemiological  and / or clinical management purposes  to differentiate between  SARS-CoV-2 and other Sarbecovirus currently known to infect humans.  If clinically indicated additional testing with an alternate test  methodology [phone]) is advised. The SARS-CoV-2 RNA is generally  detectable in upper and lower respiratory sp ecimens during the acute  phase of infection. The expected result is Negative. Fact Sheet for Patients:  StrictlyIdeas.no Fact Sheet for Healthcare  Providers: BankingDealers.co.za This test is not yet approved or cleared by the [place] FDA and has been authorized for detection and/or diagnosis of SARS-CoV-2 by FDA under an Emergency Use Authorization (EUA).  This EUA will remain in effect (meaning this test can be used) for the duration of the COVID-19 declaration under Section 564(b)(1) of the Act, 21 U.S.C. section 360bbb-3(b)(1), unless the authorization is terminated or revoked sooner. Performed at [facility], [address] [place], [place] [address]    Studies/Results: No results found.  Medications:  Prior to Admission:  Medications Prior to Admission  Medication Sig Dispense Refill Last Dose  . ALPRAZolam (XANAX) 1 MG tablet Take 1 mg by mouth 3 (three) times daily.    09/17/2019 at Unknown time  . amLODipine (NORVASC) 2.5 MG tablet Take 1 tablet (2.5 mg total) by mouth daily. 30 tablet 1 09/17/2019 at Unknown time  . atorvastatin (LIPITOR) 10 MG tablet Take 1 tablet (10 mg total) by mouth daily at  6 PM. 30 tablet 1 unknown  . carvedilol (COREG) 6.25 MG tablet Take 6.25 mg by mouth 2 (two) times daily with a meal.   09/17/2019 at 0800am  . divalproex (DEPAKOTE) 500 MG DR tablet Take 500 mg by mouth daily. Take 500 mg every morning   09/17/2019 at Unknown time  . divalproex (DEPAKOTE) 500 MG DR tablet Take 1,000 mg by mouth at bedtime.   unknown  . ergocalciferol (VITAMIN D2) 1.25 MG (50000 UT) capsule Take 50,000 Units by mouth once a week.   09/12/2019  . furosemide (LASIX) 20 MG tablet Take 20 mg by mouth daily.   09/17/2019 at Unknown time  . hydrOXYzine (ATARAX/VISTARIL) 25 MG tablet Take 25 mg by mouth 2 (two) times daily.   09/17/2019 at Unknown time  . hydrOXYzine (VISTARIL) 50 MG capsule Take 50 mg by mouth 3 (three) times daily as needed for anxiety.   09/06/2019  . iron polysaccharides (NIFEREX) 150 MG capsule Take 150 mg by mouth daily.   09/17/2019 at Unknown time  . LORazepam (ATIVAN) 0.5 MG  tablet Take 0.5 mg by mouth every 6 (six) hours as needed for anxiety.    09/16/2019 at Unknown time  . OLANZapine (ZYPREXA) 10 MG tablet Take 20 mg by mouth at bedtime.    09/17/2019 at Unknown time  . OLANZapine (ZYPREXA) 5 MG tablet Take 2.5 mg by mouth every morning.    09/16/2019 at Unknown time  . polyethylene glycol powder (GLYCOLAX/MIRALAX) powder Take 17 g by mouth daily.   unknown  . promethazine (PHENERGAN) 25 MG tablet Take 25 mg by mouth every 4 (four) hours as needed for nausea or vomiting.   unknown  . QUEtiapine (SEROQUEL) 100 MG tablet Take 200 mg by mouth at bedtime.   unknown  . QUEtiapine (SEROQUEL) 25 MG tablet Take 12.5 mg by mouth 2 (two) times daily.   09/17/2019 at Unknown time  . sodium bicarbonate 650 MG tablet Take 650 mg by mouth 2 (two) times daily.   09/17/2019 at Unknown time  . traZODone (DESYREL) 150 MG tablet Take 150 mg by mouth at bedtime.   unknown  . albuterol (PROVENTIL HFA;VENTOLIN HFA) 108 (90 BASE) MCG/ACT inhaler Inhale 2 puffs into the lungs every 4 (four) hours as needed. For shortness of breath   Not Taking at Unknown time  . aspirin 325 MG tablet Take 1 tablet (325 mg total) by mouth daily. (Patient not taking: Reported on 09/17/2019) 30 tablet 9 Not Taking at Unknown time  . sodium chloride (OCEAN) 0.65 % SOLN nasal spray Place 1 spray into both nostrils as needed for congestion.   Not Taking at Unknown time  . zolpidem (AMBIEN) 10 MG tablet Take 10 mg by mouth at bedtime as needed for sleep.   Not Taking at Unknown time   Scheduled: . ALPRAZolam  1 mg Oral TID  . amLODipine  2.5 mg Oral Daily  . atorvastatin  10 mg Oral QHS  . carvedilol  6.25 mg Oral BID WC  . divalproex  1,000 mg Oral QHS  . divalproex  1,000 mg Oral q morning - 10a  . enoxaparin (LOVENOX) injection  30 mg Subcutaneous Q24H  . hydrOXYzine  25 mg Oral BID  . OLANZapine  10 mg Oral QHS  . OLANZapine  5 mg Oral Daily  . QUEtiapine  200 mg Oral QHS  . sodium bicarbonate  650 mg Oral  BID  . traZODone  150 mg Oral QHS   Continuous:  [id number] **OR** acetaminophen, labetalol, ondansetron **OR** ondansetron (ZOFRAN) IV, polyethylene glycol  Assesment: He has chronic renal failure.  It is not entirely clear if this is acute on chronic or just progression of his chronic renal failure.  I have not seen him for over 4 years and he is being followed by a different primary care physician as an outpatient.  He has documentation from 2013 that he did not want dialysis at that time.  He also states emphatically that he does not want dialysis now.  He has been seen by palliative care and plans are for him to have hospice care.  He has schizophrenia which complicates his situation.  He was evaluated by psychiatry via tele-psychiatry and is not felt to need inpatient care.  His Depakote level was low so I have increased his dose Active Problems:   Acute on chronic renal failure (HCC)   Goals of care, counseling/discussion   DNR (do not resuscitate) discussion   Palliative care by specialist    Plan: Continue treatments.    LOS: 1 day   Alonza Bogus 09/20/2019, 8:20 AM

## 2019-09-20 NOTE — TOC Progression Note (Signed)
Transition of Care [facility]) - Progression Note    Patient Details  Name: ANISETO JOHNES MRN: [id number] Date of Birth: 03-02-1961  Transition of Care [facility]) CM/SW [place], [place] Phone Number: 09/20/2019, 10:54 AM  Clinical Narrative:   Spoke with both sister and Ms Gwenith Daily [facility].  Both agree that patient should return there as he has a long history with the staff and facility, and it makes no sense to change at this point.  Ms Jefm Bryant stressed she was simply trying to get him help as his behavior has been escalating over the past 6 weeks.  I explained to both that his Depakote level was low and that the Dr has made an adjustment.  I also stressed the importance of seeing a psychiatrist due to his diagnosis and multiple psychiatric medications, and both agree that I should get him an appointment.  Ms Jefm Bryant states patient had palliative/hospice services in the past, but was d/ced due to medical stability.  She is open to another referral for assessment purposes/intervention if necessary. She also states patient is very good about taking medications, and she has never had any problems with cheeking, but also points out that he has been on the same medication for the 5 years he has been with her, and may need to be tried on a different mood stabilizer or anti-psychotic.   Will plan on releasing patient to [facility] when stable for discharge.  Will also need order for community hospice services. TOC will continue to follow during course of hospitalization.     Expected Discharge Plan: Home w Hospice Care Barriers to Discharge: Other (comment)(Facility not identified as of yet)  Expected Discharge Plan and Services Expected Discharge Plan: [place]   Discharge Planning Services: CM Consult   Living arrangements for the past 2 months: Group Home                                       Social Determinants of Health (SDOH) Interventions     Readmission Risk Interventions No flowsheet data found.

## 2019-09-20 NOTE — Progress Notes (Signed)
Patient ID: Kyle Valenzuela, male   DOB: 1961/06/22, 58 y.o.   MRN: [id number] S: Pt is calm after finishing his breakfast.  No new complaints.  Results of Palliative care consult noted O:BP (!) 154/86 (BP Location: Left Arm)   Pulse 73   Temp (!) 97.4 F (36.3 C) (Oral)   Resp 19   Ht 5\' 8"  (1.727 m)   Wt 70.2 kg   SpO2 100%   BMI 23.53 kg/m   Intake/Output Summary (Last 24 hours) at 09/20/2019 0904 Last data filed at 09/19/2019 2324 Gross per 24 hour  Intake 1440 ml  Output 3450 ml  Net -2010 ml   Intake/Output: I/O last 3 completed shifts: In: 1920 [P.O.:1920] Out: 5650 [Urine:5650]  Intake/Output this shift:  No intake/output data recorded. Weight change:  Gen: NAD CVS: no rub Resp: cta Abd: +BS, soft, NT Ext: no edema  Recent Labs  Lab 09/18/19 1532 09/19/19 0455 09/20/19 0456  NA 142 143 136  K 4.7 4.3 4.1  CL 107 110 104  CO2 22 26 19*  GLUCOSE 135* 75 78  BUN 73* 67* 69*  CREATININE 7.73* 7.57* 7.20*  ALBUMIN 4.1  --  3.3*  CALCIUM 9.8 9.5 9.0  PHOS  --   --  6.0*  AST 23  --   --   ALT 20  --   --    Liver Function Tests: Recent Labs  Lab 09/18/19 1532 09/20/19 0456  AST 23  --   ALT 20  --   ALKPHOS 76  --   BILITOT 0.6  --   PROT 7.4  --   ALBUMIN 4.1 3.3*   Recent Labs  Lab 09/18/19 1532  LIPASE 31   No results for input(s): AMMONIA in the last 168 hours. CBC: Recent Labs  Lab 09/18/19 1532 09/20/19 0456  WBC 5.8 6.6  NEUTROABS 4.4  --   HGB 12.2* 10.6*  HCT 38.7* 32.5*  MCV 99.0 98.5  PLT 157 124*   Cardiac Enzymes: Recent Labs  Lab 09/18/19 1532  CKTOTAL 315   CBG: Recent Labs  Lab 09/18/19 1900 09/18/19 2035  GLUCAP 119* 135*    Iron Studies: No results for input(s): IRON, TIBC, TRANSFERRIN, FERRITIN in the last 72 hours. Studies/Results: No results found. . ALPRAZolam  1 mg Oral TID  . amLODipine  2.5 mg Oral Daily  . atorvastatin  10 mg Oral QHS  . carvedilol  6.25 mg Oral BID WC  . divalproex  1,000  mg Oral q morning - 10a  . divalproex  1,500 mg Oral QHS  . enoxaparin (LOVENOX) injection  30 mg Subcutaneous Q24H  . hydrOXYzine  25 mg Oral BID  . OLANZapine  10 mg Oral QHS  . OLANZapine  5 mg Oral Daily  . QUEtiapine  200 mg Oral QHS  . sodium bicarbonate  650 mg Oral BID  . traZODone  150 mg Oral QHS    BMET    Component Value Date/Time   NA 136 09/20/2019 0456   K 4.1 09/20/2019 0456   CL 104 09/20/2019 0456   CO2 19 (L) 09/20/2019 0456   GLUCOSE 78 09/20/2019 0456   BUN 69 (H) 09/20/2019 0456   CREATININE 7.20 (H) 09/20/2019 0456   CALCIUM 9.0 09/20/2019 0456   GFRNONAA 7 (L) 09/20/2019 0456   GFRAA 9 (L) 09/20/2019 0456   CBC    Component Value Date/Time   WBC 6.6 09/20/2019 0456   RBC 3.30 (L) 09/20/2019 [id number]  HGB 10.6 (L) 09/20/2019 0456   HCT 32.5 (L) 09/20/2019 0456   PLT 124 (L) 09/20/2019 0456   MCV 98.5 09/20/2019 0456   MCH 32.1 09/20/2019 0456   MCHC 32.6 09/20/2019 0456   RDW 13.5 09/20/2019 0456   LYMPHSABS 0.8 09/18/2019 1532   MONOABS 0.6 09/18/2019 1532   EOSABS 0.0 09/18/2019 1532   BASOSABS 0.0 09/18/2019 1532       Assessment/Plan: 1.   Progressive CKD stage 5- He has had advanced CKD for the past 7 years and this is likely progressive disease as he is relatively asymptomatic.  I discussed with him the severity and chronicity of his CKD on 09/19/19.  He was quite clear that he would not have another access surgery and would rather die than undergo HD.  He is a poor candidate given his behavioral issues as well.  Palliative care was consulted to help set goals/limits of care and confirmed his desire not to pursue RRT as well as his family's agreement.  He will be followed by hospice after he is placed.  Will sign off at this point as there is nothing more to add.  Please feel free to contact me with any questions or concerns.  2. 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  On multiple medications.  Will need new placement and possible  psych evaluation 3. COPD- stable 4. HTN- resume outpatient meds and follow.  5. Disposition- palliative care consult and placement pending.  Donetta Potts, MD [employer] [phone]

## 2019-09-20 NOTE — Progress Notes (Signed)
Pt pulled his IV out. Yelling, "Nurse, Nurse. I pulled my IV out. Make the bleeding stop." Pressure placed on site, gauze dressing placed over top. Pt refusing to let RN place new IV. Dr. Myna Hidalgo paged and made aware.

## 2019-10-03 ENCOUNTER — Telehealth: Payer: Self-pay

## 2019-10-03 NOTE — Telephone Encounter (Signed)
Call from [facility].  Phone call was disconnected.

## 2019-11-13 ENCOUNTER — Encounter (HOSPITAL_COMMUNITY): Payer: Self-pay | Admitting: Emergency Medicine

## 2019-11-13 ENCOUNTER — Emergency Department (HOSPITAL_COMMUNITY)

## 2019-11-13 ENCOUNTER — Emergency Department (HOSPITAL_COMMUNITY)
Admission: EM | Admit: 2019-11-13 | Discharge: 2019-11-16 | Disposition: A | Discharge status: Skilled Nursing Facility | Attending: Emergency Medicine | Admitting: Emergency Medicine

## 2019-11-13 ENCOUNTER — Other Ambulatory Visit: Payer: Self-pay

## 2019-11-13 DIAGNOSIS — N186 End stage renal disease: Secondary | ICD-10-CM | POA: Diagnosis not present

## 2019-11-13 DIAGNOSIS — F17210 Nicotine dependence, cigarettes, uncomplicated: Secondary | ICD-10-CM | POA: Diagnosis not present

## 2019-11-13 DIAGNOSIS — Z20828 Contact with and (suspected) exposure to other viral communicable diseases: Secondary | ICD-10-CM | POA: Insufficient documentation

## 2019-11-13 DIAGNOSIS — F99 Mental disorder, not otherwise specified: Secondary | ICD-10-CM | POA: Insufficient documentation

## 2019-11-13 DIAGNOSIS — I120 Hypertensive chronic kidney disease with stage 5 chronic kidney disease or end stage renal disease: Secondary | ICD-10-CM | POA: Insufficient documentation

## 2019-11-13 DIAGNOSIS — Z8673 Personal history of transient ischemic attack (TIA), and cerebral infarction without residual deficits: Secondary | ICD-10-CM | POA: Diagnosis not present

## 2019-11-13 DIAGNOSIS — J449 Chronic obstructive pulmonary disease, unspecified: Secondary | ICD-10-CM | POA: Insufficient documentation

## 2019-11-13 DIAGNOSIS — Z79899 Other long term (current) drug therapy: Secondary | ICD-10-CM | POA: Diagnosis not present

## 2019-11-13 DIAGNOSIS — R4182 Altered mental status, unspecified: Secondary | ICD-10-CM | POA: Insufficient documentation

## 2019-11-13 LAB — COMPREHENSIVE METABOLIC PANEL
ALT: 24 U/L (ref 0–44)
AST: 31 U/L (ref 15–41)
Albumin: 3 g/dL — ABNORMAL LOW (ref 3.5–5.0)
Alkaline Phosphatase: 78 U/L (ref 38–126)
Anion gap: 12 (ref 5–15)
BUN: 96 mg/dL — ABNORMAL HIGH (ref 6–20)
CO2: 23 mmol/L (ref 22–32)
Calcium: 8.3 mg/dL — ABNORMAL LOW (ref 8.9–10.3)
Chloride: 100 mmol/L (ref 98–111)
Creatinine, Ser: 9.47 mg/dL — ABNORMAL HIGH (ref 0.61–1.24)
GFR calc Af Amer: 6 mL/min — ABNORMAL LOW (ref >60)
GFR calc non Af Amer: 5 mL/min — ABNORMAL LOW (ref >60)
Glucose, Bld: 105 mg/dL — ABNORMAL HIGH (ref 70–99)
Potassium: 3.8 mmol/L (ref 3.5–5.1)
Sodium: 135 mmol/L (ref 135–145)
Total Bilirubin: 1 mg/dL (ref 0.3–1.2)
Total Protein: 5.7 g/dL — ABNORMAL LOW (ref 6.5–8.1)

## 2019-11-13 LAB — URINALYSIS, COMPLETE (UACMP) WITH MICROSCOPIC
Bacteria, UA: NONE SEEN
Bilirubin Urine: NEGATIVE
Glucose, UA: 50 mg/dL — AB
Ketones, ur: NEGATIVE mg/dL
Leukocytes,Ua: NEGATIVE
Nitrite: NEGATIVE
Protein, ur: 30 mg/dL — AB
Specific Gravity, Urine: 1.005 (ref 1.005–1.030)
pH: 6 (ref 5.0–8.0)

## 2019-11-13 LAB — CBC WITH DIFFERENTIAL/PLATELET
Abs Immature Granulocytes: 0.04 10*3/uL (ref 0.00–0.07)
Basophils Absolute: 0 10*3/uL (ref 0.0–0.1)
Basophils Relative: 0 %
Eosinophils Absolute: 0.2 10*3/uL (ref 0.0–0.5)
Eosinophils Relative: 8 %
HCT: 25.3 % — ABNORMAL LOW (ref 39.0–52.0)
Hemoglobin: 8.2 g/dL — ABNORMAL LOW (ref 13.0–17.0)
Immature Granulocytes: 2 %
Lymphocytes Relative: 43 %
Lymphs Abs: 1.1 10*3/uL (ref 0.7–4.0)
MCH: 33.1 pg (ref 26.0–34.0)
MCHC: 32.4 g/dL (ref 30.0–36.0)
MCV: 102 fL — ABNORMAL HIGH (ref 80.0–100.0)
Monocytes Absolute: 0.3 10*3/uL (ref 0.1–1.0)
Monocytes Relative: 10 %
Neutro Abs: 1 10*3/uL — ABNORMAL LOW (ref 1.7–7.7)
Neutrophils Relative %: 37 %
Platelets: 63 10*3/uL — ABNORMAL LOW (ref 150–400)
RBC: 2.48 MIL/uL — ABNORMAL LOW (ref 4.22–5.81)
RDW: 16.1 % — ABNORMAL HIGH (ref 11.5–15.5)
WBC: 2.5 10*3/uL — ABNORMAL LOW (ref 4.0–10.5)
nRBC: 0 % (ref 0.0–0.2)

## 2019-11-13 LAB — CBG MONITORING, ED: Glucose-Capillary: 109 mg/dL — ABNORMAL HIGH (ref 70–99)

## 2019-11-13 LAB — VALPROIC ACID LEVEL: Valproic Acid Lvl: 76 ug/mL (ref 50.0–100.0)

## 2019-11-13 LAB — AMMONIA: Ammonia: 30 umol/L (ref 9–35)

## 2019-11-13 MED ORDER — AMLODIPINE BESYLATE 5 MG PO TABS
2.5000 mg | ORAL_TABLET | Freq: Every morning | ORAL | Status: DC
  Administered 2019-11-14 – 2019-11-16 (×3): 2.5 mg via ORAL
  Filled 2019-11-13 (×3): qty 1

## 2019-11-13 MED ORDER — TRAZODONE HCL 50 MG PO TABS
150.0000 mg | ORAL_TABLET | Freq: Every day | ORAL | Status: DC
  Administered 2019-11-13 – 2019-11-14 (×2): 150 mg via ORAL
  Filled 2019-11-13 (×2): qty 3

## 2019-11-13 MED ORDER — PROMETHAZINE HCL 12.5 MG PO TABS: 25.0000 mg | ORAL_TABLET | ORAL | Status: DC | PRN

## 2019-11-13 MED ORDER — HYDROXYZINE HCL 25 MG PO TABS
25.0000 mg | ORAL_TABLET | Freq: Two times a day (BID) | ORAL | Status: DC
  Administered 2019-11-13 – 2019-11-16 (×5): 25 mg via ORAL
  Filled 2019-11-13 (×6): qty 1

## 2019-11-13 MED ORDER — ATORVASTATIN CALCIUM 10 MG PO TABS
10.0000 mg | ORAL_TABLET | Freq: Every evening | ORAL | Status: DC
  Administered 2019-11-13 – 2019-11-15 (×3): 10 mg via ORAL
  Filled 2019-11-13 (×3): qty 1

## 2019-11-13 MED ORDER — SODIUM BICARBONATE 650 MG PO TABS
650.0000 mg | ORAL_TABLET | Freq: Two times a day (BID) | ORAL | Status: DC
  Administered 2019-11-13 – 2019-11-16 (×5): 650 mg via ORAL
  Filled 2019-11-13 (×10): qty 1

## 2019-11-13 MED ORDER — DIVALPROEX SODIUM 250 MG PO DR TAB
1000.0000 mg | DELAYED_RELEASE_TABLET | Freq: Every morning | ORAL | Status: DC
  Administered 2019-11-14 – 2019-11-16 (×3): 1000 mg via ORAL
  Filled 2019-11-13 (×3): qty 4

## 2019-11-13 MED ORDER — ALBUTEROL SULFATE HFA 108 (90 BASE) MCG/ACT IN AERS: 2.0000 | INHALATION_SPRAY | RESPIRATORY_TRACT | Status: DC | PRN

## 2019-11-13 MED ORDER — LORAZEPAM 0.5 MG PO TABS
0.5000 mg | ORAL_TABLET | Freq: Four times a day (QID) | ORAL | Status: DC | PRN
  Administered 2019-11-13: 0.5 mg via ORAL
  Filled 2019-11-13: qty 1

## 2019-11-13 MED ORDER — OLANZAPINE 5 MG PO TABS
2.5000 mg | ORAL_TABLET | Freq: Every day | ORAL | Status: DC
  Administered 2019-11-13 – 2019-11-16 (×4): 2.5 mg via ORAL
  Filled 2019-11-13 (×4): qty 1

## 2019-11-13 MED ORDER — SALINE SPRAY 0.65 % NA SOLN
1.0000 | NASAL | Status: DC | PRN
  Filled 2019-11-13: qty 44

## 2019-11-13 MED ORDER — ERGOCALCIFEROL 1.25 MG (50000 UT) PO CAPS
50000.0000 [IU] | ORAL_CAPSULE | ORAL | Status: DC
  Administered 2019-11-14: 50000 [IU] via ORAL
  Filled 2019-11-13: qty 1

## 2019-11-13 MED ORDER — QUETIAPINE FUMARATE 100 MG PO TABS
200.0000 mg | ORAL_TABLET | Freq: Every day | ORAL | Status: DC
  Administered 2019-11-13 – 2019-11-14 (×2): 200 mg via ORAL
  Filled 2019-11-13 (×2): qty 2

## 2019-11-13 MED ORDER — DIVALPROEX SODIUM 250 MG PO DR TAB
1500.0000 mg | DELAYED_RELEASE_TABLET | Freq: Every day | ORAL | Status: DC
  Administered 2019-11-13 – 2019-11-14 (×2): 1500 mg via ORAL
  Filled 2019-11-13 (×2): qty 6

## 2019-11-13 MED ORDER — CARVEDILOL 12.5 MG PO TABS
6.2500 mg | ORAL_TABLET | Freq: Two times a day (BID) | ORAL | Status: DC
  Administered 2019-11-14 – 2019-11-16 (×4): 6.25 mg via ORAL
  Filled 2019-11-13 (×6): qty 1

## 2019-11-13 MED ORDER — FUROSEMIDE 40 MG PO TABS
20.0000 mg | ORAL_TABLET | Freq: Every morning | ORAL | Status: DC
  Administered 2019-11-14 – 2019-11-16 (×3): 20 mg via ORAL
  Filled 2019-11-13 (×4): qty 1

## 2019-11-13 MED ORDER — DIVALPROEX SODIUM 250 MG PO DR TAB: 1000.0000 mg | DELAYED_RELEASE_TABLET | ORAL | Status: DC

## 2019-11-13 NOTE — ED Provider Notes (Signed)
[facility] EMERGENCY DEPARTMENT Provider Note   CSN: [id number] Arrival date & time: 11/13/19  1617     History   Chief Complaint Chief Complaint  Patient presents with  . Altered Mental Status    HPI Kyle Valenzuela is a 58 y.o. male with PMH significant for schizophrenia, COPD, CKD, and HTN who presents to the ED via EMS.  Obtained history from EMS and they report that they were called to the [facility] facility in [place], [place] after patient reportedly had been increasingly agitated, confused, and at high risk for falls.  He reportedly sustained a mechanical fall without any head injury or loss of consciousness after ambulating too quickly with a walker.  Minor skin tear to dorsum right hand near the third and fourth MCP joints.  Hemostasis achieved with compression and wound now wrapped in sterile dressing.  On my exam, patient denies any and all symptoms.  He is a relatively poor historian.  He voiced that he would like to return to [place].      HPI  Past Medical History:  Diagnosis Date  . Chronic kidney disease   . COPD (chronic obstructive pulmonary disease) ([place])   . Hyperglycemia   . Hypertension   . Polydipsia   . Schizophrenia [facility])     Patient Active Problem List   Diagnosis Date Noted  . Encounter for hospice care discussion   . Goals of care, counseling/discussion   . DNR (do not resuscitate) discussion   . Palliative care by specialist   . Acute on chronic renal failure ([place]) 09/18/2019  . 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 Chronic kidney disease (CKD), stage IV (severe) ([place]) 09/19/2015  . Essential hypertension 09/19/2015  . Gait instability 09/19/2015  . Cerebellar stroke, acute ([place])   . HLD (hyperlipidemia)   . Mental retardation   . Acute CVA (cerebrovascular accident) ([place]) 09/18/2015  . Fall 09/18/2015  . HTN (hypertension) 09/18/2015  . Stroke ([place]) 09/18/2015  . 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 Psychogenic polydipsia   . Schizophrenia, unspecified type ([place])   . End stage renal disease ([place]) 03/01/2012    Past Surgical History:  Procedure Laterality Date  . AV FISTULA PLACEMENT  04/22/2012   Procedure: ARTERIOVENOUS (AV) FISTULA CREATION;  Surgeon: Serafina Mitchell, MD;  Location: [facility];  Service: Vascular;  Laterality: Right;  . AV FISTULA PLACEMENT  07/30/12   Right Ligation of competing branches  AVF  . MULTIPLE TOOTH EXTRACTIONS    . TONSILLECTOMY     as a child        Home Medications    Prior to Admission medications   Medication Sig Start Date End Date Taking? Authorizing Provider  ALPRAZolam Duanne Moron) 1 MG tablet Take 1 mg by mouth 3 (three) times daily.    Yes [provider]  amLODipine (NORVASC) 2.5 MG tablet Take 1 tablet (2.5 mg total) by mouth daily. 09/21/15  Yes Tat, Shanon Brow, MD  atorvastatin (LIPITOR) 10 MG tablet Take 1 tablet (10 mg total) by mouth daily at 6 PM. 09/21/15  Yes Tat, Shanon Brow, MD  carvedilol (COREG) 6.25 MG tablet Take 6.25 mg by mouth 2 (two) times daily with a meal.   Yes [provider]  divalproex (DEPAKOTE) 500 MG DR tablet Take 2 in the AM and 3 at night 09/20/19  Yes Sinda Du, MD  furosemide (LASIX) 20 MG tablet Take 20 mg by mouth daily.   Yes [provider]  hydrOXYzine (ATARAX/VISTARIL) 25 MG tablet Take 25 mg by mouth 2 (two) times daily.   Yes [provider]  LORazepam (ATIVAN) 0.5 MG tablet Take 0.5 mg by mouth every 6 (six) hours as needed for anxiety.    Yes [provider]  OLANZapine (ZYPREXA) 5 MG tablet Take 2.5 mg by mouth every morning.    Yes [provider]  QUEtiapine (SEROQUEL) 25 MG tablet Take 12.5 mg by mouth 2 (two) times daily.   Yes [provider]  sodium bicarbonate 650 MG tablet Take 650 mg by mouth 2 (two) times daily.   Yes [provider]  traZODone (DESYREL) 150 MG tablet Take 150 mg by mouth at bedtime.   Yes  [provider]  albuterol (PROVENTIL HFA;VENTOLIN HFA) 108 (90 BASE) MCG/ACT inhaler Inhale 2 puffs into the lungs every 4 (four) hours as needed. For shortness of breath    [provider]  aspirin 325 MG tablet Take 1 tablet (325 mg total) by mouth daily. Patient not taking: Reported on 09/17/2019 09/21/15   TatShanon Brow, MD  ergocalciferol (VITAMIN D2) 1.25 MG (50000 UT) capsule Take 50,000 Units by mouth once a week.    [provider]  hydrOXYzine (VISTARIL) 50 MG capsule Take 50 mg by mouth 3 (three) times daily as needed for anxiety.    [provider]  iron polysaccharides (NIFEREX) 150 MG capsule Take 150 mg by mouth daily.    [provider]  OLANZapine (ZYPREXA) 10 MG tablet Take 20 mg by mouth at bedtime.     [provider]  polyethylene glycol powder (GLYCOLAX/MIRALAX) powder Take 17 g by mouth daily.    [provider]  promethazine (PHENERGAN) 25 MG tablet Take 25 mg by mouth every 4 (four) hours as needed for nausea or vomiting.    [provider]  QUEtiapine (SEROQUEL) 100 MG tablet Take 200 mg by mouth at bedtime.    [provider]  sodium chloride (OCEAN) 0.65 % SOLN nasal spray Place 1 spray into both nostrils as needed for congestion.    [provider]  zolpidem (AMBIEN) 10 MG tablet Take 10 mg by mouth at bedtime as needed for sleep.    [provider]    Family History History reviewed. No pertinent family history.  Social History Social History   Tobacco Use  . Smoking status: Current Every Day Smoker    Packs/day: 0.50    Types: Cigarettes  . Smokeless tobacco: Never Used  . Tobacco comment: 7 cigs per day  Substance Use Topics  . Alcohol use: No  . Drug use: No     Allergies   Patient has no known allergies.   Review of Systems Review of Systems  Unable to perform ROS: Dementia     Physical Exam Updated Vital Signs BP 107/70   Pulse 69   Temp 97.9  F (36.6 C) (Oral)   Resp 14   Ht 6' (1.829 m)   Wt 70.2 kg   BMI 20.99 kg/m   Physical Exam Vitals signs and nursing note reviewed. Exam conducted with a chaperone present.  Constitutional:      Appearance: Normal appearance.  HENT:     Head: Normocephalic and atraumatic.  Eyes:     General: No scleral icterus.    Conjunctiva/sclera: Conjunctivae normal.  Pulmonary:     Effort: Pulmonary effort is normal.  Skin:    General: Skin is dry.  Neurological:  Mental Status: He is alert.     GCS: GCS eye subscore is 4. GCS verbal subscore is 5. GCS motor subscore is 6.  Psychiatric:        Mood and Affect: Mood normal.        Behavior: Behavior normal.        Thought Content: Thought content normal.      ED Treatments / Results  Labs (all labs ordered are listed, but only abnormal results are displayed) Labs Reviewed  COMPREHENSIVE METABOLIC PANEL - Abnormal; Notable for the following components:      Result Value   Glucose, Bld 105 (*)    BUN 96 (*)    Creatinine, Ser 9.47 (*)    Calcium 8.3 (*)    Total Protein 5.7 (*)    Albumin 3.0 (*)    GFR calc non Af Amer 5 (*)    GFR calc Af Amer 6 (*)    All other components within normal limits  CBC WITH DIFFERENTIAL/PLATELET - Abnormal; Notable for the following components:   WBC 2.5 (*)    RBC 2.48 (*)    Hemoglobin 8.2 (*)    HCT 25.3 (*)    MCV 102.0 (*)    RDW 16.1 (*)    Platelets 63 (*)    Neutro Abs 1.0 (*)    All other components within normal limits  URINALYSIS, COMPLETE (UACMP) WITH MICROSCOPIC - Abnormal; Notable for the following components:   Color, Urine STRAW (*)    Glucose, UA 50 (*)    Hgb urine dipstick SMALL (*)    Protein, ur 30 (*)    All other components within normal limits  CBG MONITORING, ED - Abnormal; Notable for the following components:   Glucose-Capillary 109 (*)    All other components within normal limits  AMMONIA  VALPROIC ACID LEVEL    EKG None  Radiology Ct Head Wo  Contrast  Result Date: 11/13/2019 CLINICAL DATA:  Per Ed note. Pt brought in from [facility] group home for evaluation of increased falls, agitation, and some altered mental statusHx of Schizophrenia, HTN,COPD,CKD EXAM: CT HEAD WITHOUT CONTRAST TECHNIQUE: Contiguous axial images were obtained from the base of the skull through the vertex without intravenous contrast. COMPARISON:  11/04/2011 FINDINGS: Brain: No evidence of acute infarction, hemorrhage, hydrocephalus, extra-axial collection or mass lesion/mass effect. Left cerebellar encephalomalacia from prior infarction, stable. There is ventricular sulcal enlargement reflecting mild atrophy, also unchanged. Vascular: No hyperdense vessel or unexpected calcification. Skull: Normal. Negative for fracture or focal lesion. Sinuses/Orbits: Globes and orbits are unremarkable. Sinuses are clear. Other: None. IMPRESSION: 1. No acute intracranial abnormalities. 2. Old left cerebellar infarct.  Mild atrophy. Electronically Signed   By: Lajean Manes M.D.   On: 11/13/2019 18:40   Dg Chest Portable 1 View  Result Date: 11/13/2019 CLINICAL DATA:  Increased falls.  Agitation.  Altered mental status. EXAM: PORTABLE CHEST 1 VIEW COMPARISON:  10/04/2015. FINDINGS: Cardiac silhouette is normal in size. No mediastinal or hilar masses. No evidence of adenopathy. Clear lungs.  No pleural effusion or pneumothorax. Skeletal structures are grossly intact. IMPRESSION: No active disease. Electronically Signed   By: Lajean Manes M.D.   On: 11/13/2019 17:09    Procedures Procedures (including critical care time)  Medications Ordered in ED Medications - No data to display   Initial Impression / Assessment and Plan / ED Course  I have reviewed the triage vital signs and the nursing notes.  Pertinent labs & imaging results that  were available during my care of the patient were reviewed by me and considered in my medical decision making (see chart for details).         Given reports of increased falls recently, will obtain CT head without contrast to evaluate for stroke, possibly cerebellar.  Reviewed imaging which demonstrates an old left cerebellar infarct, but no acute intracranial abnormalities.  DG chest was interpreted and demonstrates no acute cardiopulmonary findings.  His CBC demonstrated pancytopenia, including hemoglobin of 8.2 and leukopenia to 2.5.  Obtained fecal occult point-of-care testing which was positive.  Patient is hemodynamically stable with no hypotension or tachycardia.  Vital signs within normal limits. CMP demonstrated no electrolyte abnormalities, but mildly bumped creatinine from his baseline to 9.47.  His UA, valproic acid level, and ammonia were all interpreted and are unremarkable.  Dr. Sedonia Small spoke with [name], his POA, who reports that he is not welcome back at [facility], but [facility] may have a bed for him.  After discussing her comprehensive lab work with her as well as his new onset pancytopenia and worsening creatinine, his sister reported that they are focused on comfort care rather than inpatient admission.  Ordered consult to case management to determine placement.  In the meantime, will place patient in ED observation.   Final Clinical Impressions(s) / ED Diagnoses   Final diagnoses:  Altered mental status, unspecified altered mental status type    ED Discharge Orders    None       Reita Chard 11/13/19 2024    Maudie Flakes, MD 11/14/19 [phone]

## 2019-11-13 NOTE — ED Notes (Signed)
[facility] Administrator Rodena Piety) [phone]. [facility] [phone]. Spoke with facility and reconciled med list.

## 2019-11-13 NOTE — ED Notes (Signed)
Patient is resting comfortably at this time

## 2019-11-13 NOTE — ED Notes (Signed)
Pt unable to reconcile medication. Attempted to contact [facility] with 2 different numbers. No answer due to phone numbers "no longer in service"

## 2019-11-13 NOTE — ED Triage Notes (Signed)
Pt brought in from [facility] group home for evaluation of increased falls, agitation, and some altered mental status.

## 2019-11-13 NOTE — ED Notes (Signed)
Pt repeatedly having to be redirected back to room.

## 2019-11-14 MED ORDER — ONDANSETRON HCL 4 MG/2ML IJ SOLN
4.0000 mg | Freq: Once | INTRAMUSCULAR | Status: AC
  Administered 2019-11-15: 4 mg via INTRAVENOUS
  Filled 2019-11-14: qty 2

## 2019-11-14 NOTE — Progress Notes (Signed)
CSW in contact with Rodena Piety from [facility] to discuss patient disposition. Rodena Piety explains that patients needs have become greater than what her staff can manage.Rodena Piety goes into detail and explains she has explored placement at [facility] and stated that Carolan Shiver could be of assistance. PH: [phone].   Pt needs new COVID test to assist with placement process.   CSW attempted to contact Carolan Shiver at [facility] without success. Unable to leave VM requesting call back.   [facility] Transitions of Care  Clinical Social Worker  Ph: [phone]

## 2019-11-14 NOTE — ED Notes (Signed)
Patient given soda. 

## 2019-11-14 NOTE — ED Notes (Signed)
Patient is awake now using his urinal.

## 2019-11-15 LAB — SARS CORONAVIRUS 2 (TAT 6-24 HRS): SARS Coronavirus 2: NEGATIVE

## 2019-11-15 NOTE — Progress Notes (Signed)
CSW in contact with [facility]: [phone] to discuss patient disposition. Claiborne Billings explains that she had received a call from [facility] about this patient but had not received any information and paperwork. Fabiola Backer states that she does have a bed available and requests required documentation.   CSW will work up this patient for ALF placment.   [facility] Transitions of Care  Clinical Social Worker  Ph: [phone]

## 2019-11-15 NOTE — ED Notes (Signed)
Pt gives verbal permission to send information to [facility]

## 2019-11-15 NOTE — NC FL2 (Signed)
[place] MEDICAID FL2 LEVEL OF CARE SCREENING TOOL     IDENTIFICATION  Patient Name: Kyle Valenzuela Birthdate: 1961/02/14 Sex: male Admission Date (Current Location): 11/13/2019  [place] and [place] Number:  Kathleen Argue [id number] [place] and Address:  [facility] [address] [place]      Provider Number: [id number]  Attending Physician Name and Address:  Default, Provider, MD  Relative Name and Phone Number:  [name] [phone]    Current Level of Care: Hospital Recommended Level of Care: [place] Prior Approval Number:    Date Approved/Denied:   PASRR Number:    Discharge Plan: Domiciliary ([facility])    Current Diagnoses: Patient Active Problem List   Diagnosis Date Noted  . Encounter for hospice care discussion   . Goals of care, counseling/discussion   . DNR (do not resuscitate) discussion   . Palliative care by specialist   . Acute on chronic renal failure ([place]) 09/18/2019  . 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 Chronic kidney disease (CKD), stage IV (severe) ([place]) 09/19/2015  . Essential hypertension 09/19/2015  . Gait instability 09/19/2015  . Cerebellar stroke, acute ([place])   . HLD (hyperlipidemia)   . Mental retardation   . Acute CVA (cerebrovascular accident) ([place]) 09/18/2015  . Fall 09/18/2015  . HTN (hypertension) 09/18/2015  . Stroke ([place]) 09/18/2015  . 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 Psychogenic polydipsia   . Schizophrenia, unspecified type ([place])   . End stage renal disease ([place]) 03/01/2012    Orientation RESPIRATION BLADDER Height & Weight     Self, Time, Situation, Place  Normal Continent Weight: 154 lb 12.2 oz (70.2 kg) Height:  6' (182.9 cm)  BEHAVIORAL SYMPTOMS/MOOD NEUROLOGICAL BOWEL NUTRITION STATUS      Continent    AMBULATORY STATUS COMMUNICATION OF NEEDS Skin   Supervision Verbally Normal                       Personal Care Assistance Level of  Assistance  Bathing, Feeding, Dressing Bathing Assistance: Limited assistance Feeding assistance: Independent Dressing Assistance: Limited assistance     Functional Limitations Info  Sight, Hearing, Speech Sight Info: Adequate Hearing Info: Adequate Speech Info: Adequate    SPECIAL CARE FACTORS FREQUENCY                       Contractures Contractures Info: Not present    Additional Factors Info  Code Status, Allergies, Psychotropic Code Status Info: DNR Allergies Info: NKA Psychotropic Info: divalproex (DEPAKOTE) DR tablet 1,000 mg, hydrOXYzine (ATARAX/VISTARIL) tablet 25 mg,traZODone (DESYREL) tablet 150 mg         Current Medications (11/15/2019):  This is the current hospital active medication list Current Facility-Administered Medications  Medication Dose Route Frequency Provider Last Rate Last Dose  . albuterol (VENTOLIN HFA) 108 (90 Base) MCG/ACT inhaler 2 puff  2 puff Inhalation Q4H PRN Corena Herter, PA-C      . amLODipine (NORVASC) tablet 2.5 mg  2.5 mg Oral q morning - 10a Corena Herter, PA-C   2.5 mg at 11/15/19 1030  . atorvastatin (LIPITOR) tablet 10 mg  10 mg Oral QPM Krista Blue L, PA-C   10 mg at 11/14/19 1706  . carvedilol (COREG) tablet 6.25 mg  6.25 mg Oral BID WC Krista Blue L, PA-C   6.25 mg at 11/15/19 1029  . divalproex (DEPAKOTE) DR tablet 1,000 mg  1,000 mg Oral q morning -  10a Corena Herter, PA-C   1,000 mg at 11/15/19 1031  . divalproex (DEPAKOTE) DR tablet 1,500 mg  1,500 mg Oral QHS Krista Blue L, PA-C   1,500 mg at 11/14/19 2200  . ergocalciferol (VITAMIN D2) capsule 50,000 Units  50,000 Units Oral Q Mon Corena Herter, PA-C   50,000 Units at 11/14/19 1232  . furosemide (LASIX) tablet 20 mg  20 mg Oral q morning - 10a Corena Herter, PA-C   20 mg at 11/15/19 1029  . hydrOXYzine (ATARAX/VISTARIL) tablet 25 mg  25 mg Oral BID Krista Blue L, PA-C   25 mg at 11/15/19 1029  . LORazepam (ATIVAN) tablet 0.5 mg  0.5 mg Oral  Q6H PRN Corena Herter, PA-C   0.5 mg at 11/13/19 2158  . OLANZapine (ZYPREXA) tablet 2.5 mg  2.5 mg Oral Daily Krista Blue L, PA-C   2.5 mg at 11/15/19 1030  . promethazine (PHENERGAN) tablet 25 mg  25 mg Oral Q4H PRN Corena Herter, PA-C      . QUEtiapine (SEROQUEL) tablet 200 mg  200 mg Oral QHS Krista Blue L, PA-C   200 mg at 11/14/19 2201  . sodium bicarbonate tablet 650 mg  650 mg Oral BID Corena Herter, PA-C   650 mg at 11/15/19 1028  . sodium chloride (OCEAN) 0.65 % nasal spray 1 spray  1 spray Each Nare PRN Corena Herter, PA-C      . traZODone (DESYREL) tablet 150 mg  150 mg Oral QHS Corena Herter, PA-C   150 mg at 11/14/19 2202   Current Outpatient Medications  Medication Sig Dispense Refill  . albuterol (PROVENTIL HFA;VENTOLIN HFA) 108 (90 BASE) MCG/ACT inhaler Inhale 2 puffs into the lungs every 4 (four) hours as needed. For shortness of breath    . ALPRAZolam (XANAX) 1 MG tablet Take 1 mg by mouth 3 (three) times daily.     Marland Kitchen amLODipine (NORVASC) 2.5 MG tablet Take 1 tablet (2.5 mg total) by mouth daily. (Patient taking differently: Take 2.5 mg by mouth every morning. ) 30 tablet 1  . atorvastatin (LIPITOR) 10 MG tablet Take 1 tablet (10 mg total) by mouth daily at 6 PM. (Patient taking differently: Take 10 mg by mouth every evening. ) 30 tablet 1  . carvedilol (COREG) 6.25 MG tablet Take 6.25 mg by mouth 2 (two) times daily with a meal.    . divalproex (DEPAKOTE) 500 MG DR tablet Take 2 in the AM and 3 at night (Patient taking differently: Take 1,000-1,500 mg by mouth See admin instructions. 1000mg  in the morning and 1500mg  at bedtime) 150 tablet 0  . ergocalciferol (VITAMIN D2) 1.25 MG (50000 UT) capsule Take 50,000 Units by mouth every Monday.     . furosemide (LASIX) 20 MG tablet Take 20 mg by mouth every morning.     . hydrOXYzine (ATARAX/VISTARIL) 25 MG tablet Take 25 mg by mouth 2 (two) times daily.    . iron polysaccharides (NIFEREX) 150 MG capsule Take 150  mg by mouth every morning.     Marland Kitchen LORazepam (ATIVAN) 0.5 MG tablet Take 0.5 mg by mouth every 6 (six) hours as needed for anxiety.     Marland Kitchen OLANZapine (ZYPREXA) 2.5 MG tablet Take 2.5 mg by mouth daily. 1pm    . OLANZapine (ZYPREXA) 20 MG tablet Take 20 mg by mouth every morning.     Marland Kitchen QUEtiapine (SEROQUEL) 100 MG tablet Take 200 mg by mouth at bedtime.    Marland Kitchen  QUEtiapine (SEROQUEL) 25 MG tablet Take 12.5 mg by mouth 2 (two) times daily.    . sodium bicarbonate 650 MG tablet Take 650 mg by mouth 2 (two) times daily.    . sodium chloride (OCEAN) 0.65 % SOLN nasal spray Place 1 spray into both nostrils as needed for congestion.    . traZODone (DESYREL) 150 MG tablet Take 150 mg by mouth at bedtime.    . promethazine (PHENERGAN) 25 MG tablet Take 25 mg by mouth every 4 (four) hours as needed for nausea or vomiting.       Discharge Medications: Please see discharge summary for a list of discharge medications.  Relevant Imaging Results:  Relevant Lab Results:   Additional Information Modena Damontay Alred, [place]

## 2019-11-15 NOTE — ED Notes (Signed)
Patient had a moderate amount of emesis. Zofran given. Patient cleaned and assisted back to bed

## 2019-11-15 NOTE — TOC Transition Note (Signed)
Transition of Care [facility]) - CM/SW Discharge Note   Patient Details  Name: Kyle Valenzuela MRN: [id number] Date of Birth: [birth year]  Transition of [facility]) CM/SW Contact:  Daffney Greenly Dimitri Ped, LCSW Phone Number: 11/15/2019, 3:50 PM   Clinical Narrative:   Pt is a 58 year old male who was seen in the ED after experiencing multiple falls at [facility] home. Pt has secured a bed at [facility].   [facility] made [facility] of [facility] transition.   EDP made aware. CSW will arrange transportation with [facility].  [place] Transitions of Care  Clinical Social Worker  Ph: [phone]    Final next level of care: Assisted Living Barriers to Discharge: SNF Pending transportation, SNF Pending discharge orders   Patient Goals and CMS Choice Patient states their goals for this hospitalization and ongoing recovery are:: to discharge to a more appropriate facility that can meet his needs better      Discharge Placement              [facility] at[facility]) Patient to be transferred to facility by: [facility] Name of family member notified: [name] [phone] Patient and family notified of of transfer: 11/15/19  Discharge Plan and Services                                     Social Determinants of Health (SDOH) Interventions     Readmission Risk Interventions No flowsheet data found.

## 2019-11-15 NOTE — Progress Notes (Addendum)
CSW received a call from pt's RN who stated pt provided verbal permission to send over pt's required medical paperwork and FL-2 to [facility] to [place] - [phone].  CSW spoke to the [facility] ED Advanced Care Supervisor who stated the pt's FL-2/paperwork can be sent via [place] email, (email verified with Claiborne Billings at [facility]) to facilitate a timely discharge for the best care of the pt.  Claiborne Billings at [facility] stated she can be reached [phone] Claiborne Billings on 11/24.  CSW will continue to follow for D/C needs.  Alphonse Guild. Lorey Pallett, LCSW, LCAS, CSI Transitions of Care Clinical Social Worker Care Coordination Department Ph: [phone]

## 2019-11-16 MED ORDER — GLYCERIN (LAXATIVE) 1.2 G RE SUPP
1.0000 | Freq: Once | RECTAL | Status: DC
  Filled 2019-11-16: qty 1

## 2019-11-16 NOTE — ED Notes (Signed)
Patient ate lunch then vomited. Said he felt ok but wanted to shower. Assisted him with his shower and he stated he feels much better

## 2019-11-16 NOTE — ED Notes (Signed)
Pt urinated on himself. Am changing patient's pants

## 2019-11-16 NOTE — ED Notes (Signed)
Per nightshift nurse, pt's FL2 was not sent to [facility] yesterday. Therefore, transportation and acceptance to this facility will be delayed. Will contact social worker

## 2019-11-16 NOTE — [place] FL2 (Signed)
MEDICAID FL2 LEVEL OF CARE SCREENING TOOL     IDENTIFICATION  Patient Name: Kyle Valenzuela Birthdate: March 15, 1961 Sex: male Admission Date (Current Location): 11/13/2019  [place] and [place] Number:  Kathleen Argue [id number] [place] and Address:  [facility] [address] [place]      Provider Number: [id number]  Attending Physician Name and Address:  No att. providers found  Relative Name and Phone Number:  [name] [phone]    Current Level of Care: Hospital Recommended Level of Care: [place] Prior Approval Number:    Date Approved/Denied:   PASRR Number:    Discharge Plan: Domiciliary (Rest home)    Current Diagnoses: Patient Active Problem List   Diagnosis Date Noted  . Encounter for hospice care discussion   . Goals of care, counseling/discussion   . DNR (do not resuscitate) discussion   . Palliative care by specialist   . Acute on chronic renal failure ([place]) 09/18/2019  . 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 Chronic kidney disease (CKD), stage IV (severe) ([place]) 09/19/2015  . Essential hypertension 09/19/2015  . Gait instability 09/19/2015  . Cerebellar stroke, acute ([place])   . HLD (hyperlipidemia)   . Mental retardation   . Acute CVA (cerebrovascular accident) ([place]) 09/18/2015  . Fall 09/18/2015  . HTN (hypertension) 09/18/2015  . Stroke ([place]) 09/18/2015  . 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 Psychogenic polydipsia   . Schizophrenia, unspecified type ([place])   . End stage renal disease ([place]) 03/01/2012    Orientation RESPIRATION BLADDER Height & Weight     Self, Time, Situation, Place  Normal Continent Weight: 70.2 kg Height:  6' (182.9 cm)  BEHAVIORAL SYMPTOMS/MOOD NEUROLOGICAL BOWEL NUTRITION STATUS      Continent regular  AMBULATORY STATUS COMMUNICATION OF NEEDS Skin   Supervision Verbally Normal                       Personal Care Assistance Level of Assistance   Bathing, Feeding, Dressing Bathing Assistance: Limited assistance Feeding assistance: Independent Dressing Assistance: Limited assistance     Functional Limitations Info  Sight, Hearing, Speech Sight Info: Adequate Hearing Info: Adequate Speech Info: Adequate    SPECIAL CARE FACTORS FREQUENCY                       Contractures Contractures Info: Not present    Additional Factors Info  Code Status, Allergies, Psychotropic Code Status Info: DNR Allergies Info: NKA Psychotropic Info: divalproex (DEPAKOTE) DR tablet 1,000 mg, hydrOXYzine (ATARAX/VISTARIL) tablet 25 mg,traZODone (DESYREL) tablet 150 mg         Current Medications (11/16/2019):  This is the current hospital active medication list Current Facility-Administered Medications  Medication Dose Route Frequency Provider Last Rate Last Dose  . albuterol (VENTOLIN HFA) 108 (90 Base) MCG/ACT inhaler 2 puff  2 puff Inhalation Q4H PRN Corena Herter, PA-C      . amLODipine (NORVASC) tablet 2.5 mg  2.5 mg Oral q morning - 10a Corena Herter, PA-C   2.5 mg at 11/16/19 1109  . atorvastatin (LIPITOR) tablet 10 mg  10 mg Oral QPM Krista Blue L, PA-C   10 mg at 11/15/19 1851  . carvedilol (COREG) tablet 6.25 mg  6.25 mg Oral BID WC Krista Blue L, PA-C   6.25 mg at 11/16/19 [id number]  . divalproex (DEPAKOTE) DR tablet 1,000 mg  1,000 mg Oral q morning - 10a Corena Herter,  PA-C   1,000 mg at 11/16/19 1110  . divalproex (DEPAKOTE) DR tablet 1,500 mg  1,500 mg Oral QHS Krista Blue L, PA-C   1,500 mg at 11/14/19 2200  . ergocalciferol (VITAMIN D2) capsule 50,000 Units  50,000 Units Oral Q Mon Corena Herter, PA-C   50,000 Units at 11/14/19 1232  . furosemide (LASIX) tablet 20 mg  20 mg Oral q morning - 10a Corena Herter, PA-C   20 mg at 11/16/19 1110  . glycerin (Pediatric) 1.2 g suppository 1.2 g  1 suppository Rectal Once Margarita Mail, PA-C      . hydrOXYzine (ATARAX/VISTARIL) tablet 25 mg  25 mg Oral BID Krista Blue L, PA-C   25 mg at 11/16/19 1109  . LORazepam (ATIVAN) tablet 0.5 mg  0.5 mg Oral Q6H PRN Corena Herter, PA-C   0.5 mg at 11/13/19 2158  . OLANZapine (ZYPREXA) tablet 2.5 mg  2.5 mg Oral Daily Krista Blue L, PA-C   2.5 mg at 11/16/19 1110  . promethazine (PHENERGAN) tablet 25 mg  25 mg Oral Q4H PRN Corena Herter, PA-C      . QUEtiapine (SEROQUEL) tablet 200 mg  200 mg Oral QHS Krista Blue L, PA-C   200 mg at 11/14/19 2201  . sodium bicarbonate tablet 650 mg  650 mg Oral BID Corena Herter, PA-C   650 mg at 11/16/19 1110  . sodium chloride (OCEAN) 0.65 % nasal spray 1 spray  1 spray Each Nare PRN Corena Herter, PA-C      . traZODone (DESYREL) tablet 150 mg  150 mg Oral QHS Corena Herter, PA-C   150 mg at 11/14/19 2202   Current Outpatient Medications  Medication Sig Dispense Refill  . albuterol (PROVENTIL HFA;VENTOLIN HFA) 108 (90 BASE) MCG/ACT inhaler Inhale 2 puffs into the lungs every 4 (four) hours as needed. For shortness of breath    . ALPRAZolam (XANAX) 1 MG tablet Take 1 mg by mouth 3 (three) times daily.     Marland Kitchen amLODipine (NORVASC) 2.5 MG tablet Take 1 tablet (2.5 mg total) by mouth daily. (Patient taking differently: Take 2.5 mg by mouth every morning. ) 30 tablet 1  . atorvastatin (LIPITOR) 10 MG tablet Take 1 tablet (10 mg total) by mouth daily at 6 PM. (Patient taking differently: Take 10 mg by mouth every evening. ) 30 tablet 1  . carvedilol (COREG) 6.25 MG tablet Take 6.25 mg by mouth 2 (two) times daily with a meal.    . divalproex (DEPAKOTE) 500 MG DR tablet Take 2 in the AM and 3 at night (Patient taking differently: Take 1,000-1,500 mg by mouth See admin instructions. 1000mg  in the morning and 1500mg  at bedtime) 150 tablet 0  . ergocalciferol (VITAMIN D2) 1.25 MG (50000 UT) capsule Take 50,000 Units by mouth every Monday.     . furosemide (LASIX) 20 MG tablet Take 20 mg by mouth every morning.     . hydrOXYzine (ATARAX/VISTARIL) 25 MG tablet Take 25 mg  by mouth 2 (two) times daily.    . iron polysaccharides (NIFEREX) 150 MG capsule Take 150 mg by mouth every morning.     Marland Kitchen LORazepam (ATIVAN) 0.5 MG tablet Take 0.5 mg by mouth every 6 (six) hours as needed for anxiety.     Marland Kitchen OLANZapine (ZYPREXA) 2.5 MG tablet Take 2.5 mg by mouth daily. 1pm    . OLANZapine (ZYPREXA) 20 MG tablet Take 20 mg by mouth every morning.     Marland Kitchen  QUEtiapine (SEROQUEL) 100 MG tablet Take 200 mg by mouth at bedtime.    Marland Kitchen QUEtiapine (SEROQUEL) 25 MG tablet Take 12.5 mg by mouth 2 (two) times daily.    . sodium bicarbonate 650 MG tablet Take 650 mg by mouth 2 (two) times daily.    . sodium chloride (OCEAN) 0.65 % SOLN nasal spray Place 1 spray into both nostrils as needed for congestion.    . traZODone (DESYREL) 150 MG tablet Take 150 mg by mouth at bedtime.    . promethazine (PHENERGAN) 25 MG tablet Take 25 mg by mouth every 4 (four) hours as needed for nausea or vomiting.       Discharge Medications: Please see discharge summary for a list of discharge medications.  Relevant Imaging Results:  Relevant Lab Results:   Additional Information [place], Rexene Alberts, RN

## 2019-11-16 NOTE — Progress Notes (Signed)
Update on Patient disposition: CSW has confirmed and secured a bed at [facility].  The Initial electronic Fl2 that was completed and submitted  by 1st shift CSW on 11/15/2019, was not accepted by [facility]. [place] explained to this CSW that the carbon paper copy must be completed and signed by MD. Scheduled discharge to occur on 11/25 after 2pm.   2nd copy of FL2 submitted to ALF on 11/16/2019.   [place] [facility]  Clinical Social Worker  Ph: [phone]

## 2019-11-16 NOTE — ED Notes (Signed)
Have given pt back belongings.

## 2019-11-16 NOTE — ED Notes (Signed)
Pt left with EMS 

## 2019-11-16 NOTE — Discharge Instructions (Signed)
Return to the emergency department for any new and/or concerning symptoms. 

## 2019-11-17 NOTE — Progress Notes (Signed)
11/15/2019 updated FL2 faxed to [facility], with diet included. [place], [place] ED TOC CM [phone]

## 2019-11-20 NOTE — ED Provider Notes (Signed)
[facility] called requesting the prescription for lorazepam that was left off his d/c meds.  This was faxed to the facility.   Isla Pence, MD 11/20/19 [phone]

## 2019-12-23 DEATH — deceased

## 2020-04-13 IMAGING — DX DG CHEST 1V PORT
1 series · 1 of 1 positions shown · non-contrast
Comparison: 10/04/2015.

CLINICAL DATA: Increased falls.  Agitation.  Altered mental status.

EXAM:
PORTABLE CHEST 1 VIEW

[chest ap]
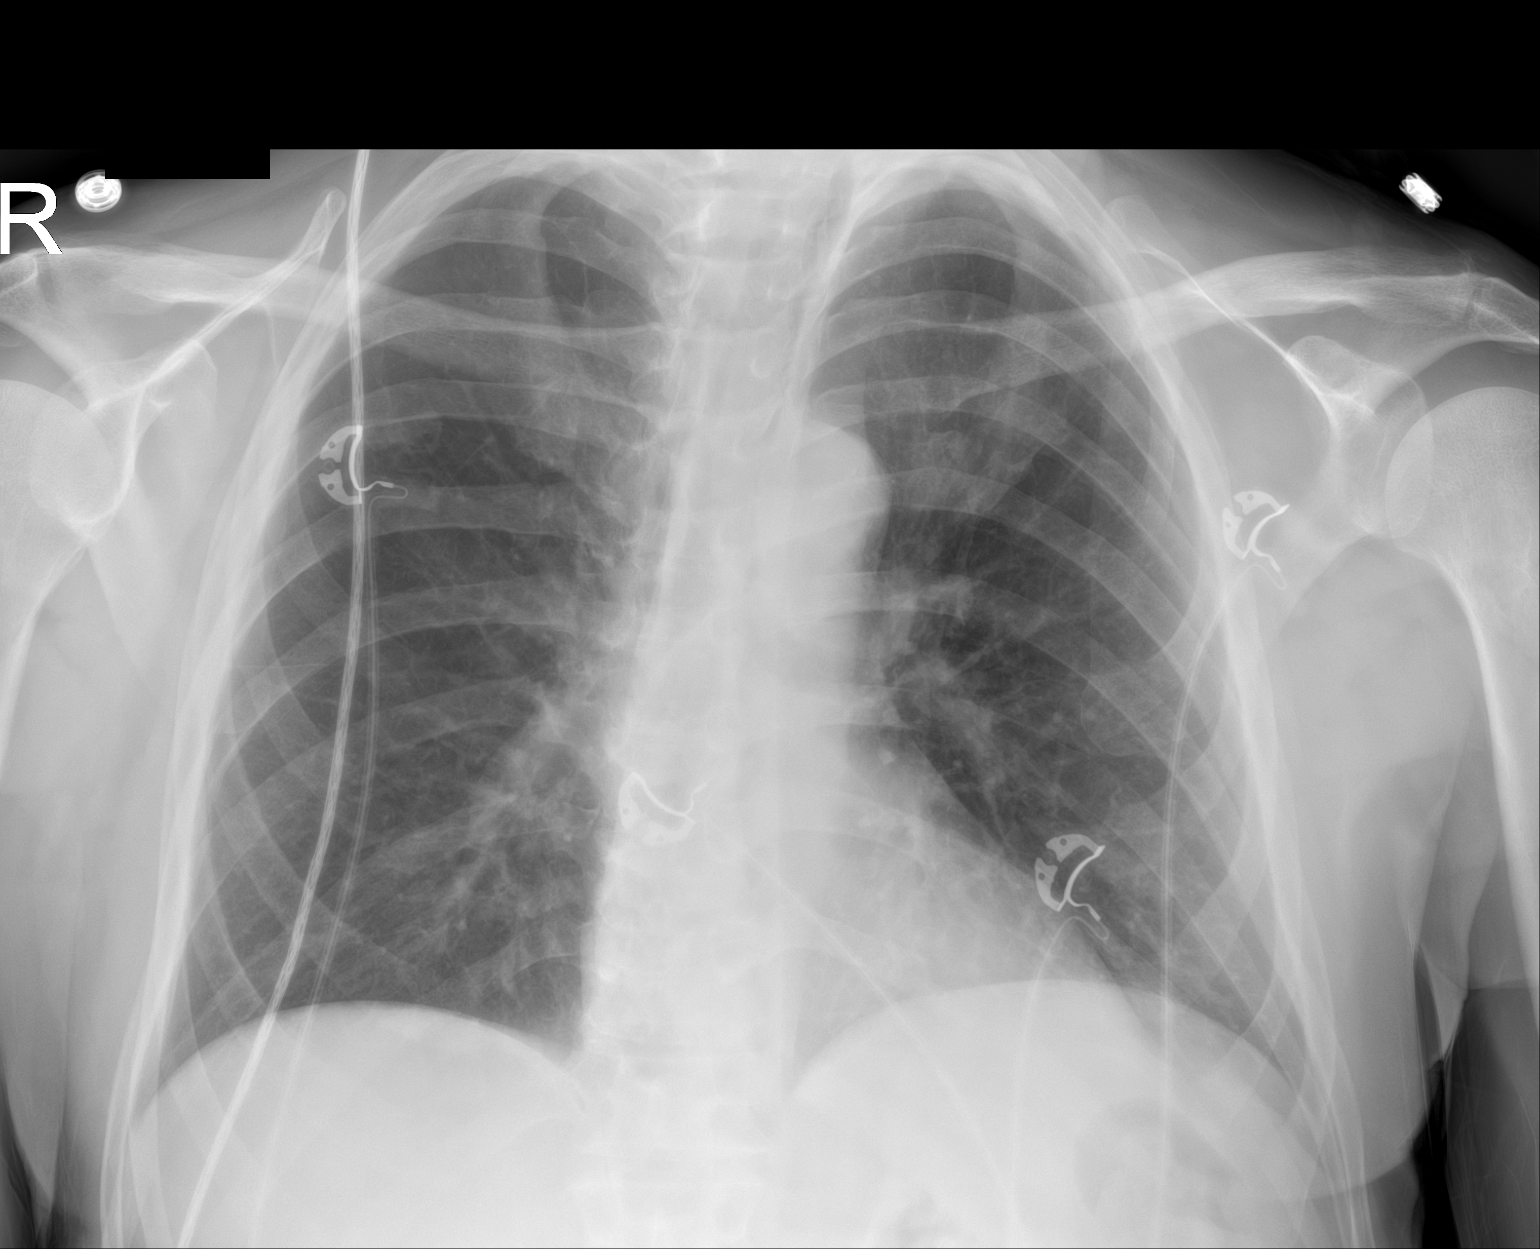

[1 of 1 positions shown; findings below may reference images not displayed]

FINDINGS: Cardiac silhouette is normal in size. No mediastinal or hilar
masses. No evidence of adenopathy.

Clear lungs.  No pleural effusion or pneumothorax.

Skeletal structures are grossly intact.
IMPRESSION: No active disease.
